# Patient Record
Sex: Female | Born: 1975 | Race: White | Hispanic: No | Marital: Single | Smoking: Never smoker
Health system: Southern US, Community
[De-identification: ages and names within clinical notes are randomized; demographics above are authoritative.]

## PROBLEM LIST (undated history)

## (undated) DIAGNOSIS — D649 Anemia, unspecified: Secondary | ICD-10-CM

## (undated) DIAGNOSIS — R5382 Chronic fatigue, unspecified: Secondary | ICD-10-CM

## (undated) DIAGNOSIS — Z86711 Personal history of pulmonary embolism: Secondary | ICD-10-CM

## (undated) DIAGNOSIS — K219 Gastro-esophageal reflux disease without esophagitis: Secondary | ICD-10-CM

## (undated) DIAGNOSIS — E079 Disorder of thyroid, unspecified: Secondary | ICD-10-CM

## (undated) DIAGNOSIS — R102 Pelvic and perineal pain unspecified side: Secondary | ICD-10-CM

## (undated) DIAGNOSIS — J45909 Unspecified asthma, uncomplicated: Secondary | ICD-10-CM

## (undated) DIAGNOSIS — M1990 Unspecified osteoarthritis, unspecified site: Secondary | ICD-10-CM

## (undated) DIAGNOSIS — M797 Fibromyalgia: Secondary | ICD-10-CM

## (undated) HISTORY — DX: Disorder of thyroid, unspecified: E07.9

## (undated) HISTORY — DX: Fibromyalgia: M79.7

## (undated) HISTORY — DX: Unspecified osteoarthritis, unspecified site: M19.90

## (undated) HISTORY — DX: Unspecified asthma, uncomplicated: J45.909

## (undated) HISTORY — DX: Gastro-esophageal reflux disease without esophagitis: K21.9

## (undated) HISTORY — DX: Personal history of pulmonary embolism: Z86.711

## (undated) HISTORY — PX: LAPAROSCOPY: SHX197

## (undated) HISTORY — DX: Chronic fatigue, unspecified: R53.82

## (undated) HISTORY — PX: OTHER SURGICAL HISTORY: SHX169

## (undated) HISTORY — DX: Pelvic and perineal pain unspecified side: R10.20

## (undated) HISTORY — DX: Anemia, unspecified: D64.9

## (undated) HISTORY — PX: HYSTEROSCOPY WITH D & C: SHX1775

## (undated) HISTORY — PX: COLONOSCOPY: SHX174

## (undated) HISTORY — DX: Pelvic and perineal pain: R10.2

## (undated) HISTORY — PX: CYSTOSCOPY: SUR368

---

## 2001-02-24 ENCOUNTER — Ambulatory Visit (HOSPITAL_COMMUNITY): Admission: RE | Admit: 2001-02-24 | Discharge: 2001-02-24 | Payer: Self-pay | Admitting: Orthopedic Surgery

## 2003-12-16 ENCOUNTER — Ambulatory Visit (HOSPITAL_COMMUNITY): Admission: RE | Admit: 2003-12-16 | Discharge: 2003-12-16 | Payer: Self-pay | Admitting: Family Medicine

## 2004-10-12 ENCOUNTER — Emergency Department (HOSPITAL_COMMUNITY): Admission: EM | Admit: 2004-10-12 | Discharge: 2004-10-12 | Payer: Self-pay | Admitting: Emergency Medicine

## 2004-11-18 ENCOUNTER — Ambulatory Visit (HOSPITAL_COMMUNITY): Admission: RE | Admit: 2004-11-18 | Discharge: 2004-11-18 | Payer: Self-pay | Admitting: Family Medicine

## 2005-06-09 ENCOUNTER — Ambulatory Visit (HOSPITAL_COMMUNITY): Admission: RE | Admit: 2005-06-09 | Discharge: 2005-06-09 | Payer: Self-pay | Admitting: Internal Medicine

## 2005-06-17 ENCOUNTER — Encounter (HOSPITAL_COMMUNITY): Admission: RE | Admit: 2005-06-17 | Discharge: 2005-07-17 | Payer: Self-pay | Admitting: Internal Medicine

## 2005-07-20 ENCOUNTER — Encounter (HOSPITAL_COMMUNITY): Admission: RE | Admit: 2005-07-20 | Discharge: 2005-08-19 | Payer: Self-pay | Admitting: Internal Medicine

## 2005-08-20 ENCOUNTER — Encounter (HOSPITAL_COMMUNITY): Admission: RE | Admit: 2005-08-20 | Discharge: 2005-09-07 | Payer: Self-pay | Admitting: Internal Medicine

## 2005-09-15 ENCOUNTER — Encounter (HOSPITAL_COMMUNITY): Admission: RE | Admit: 2005-09-15 | Discharge: 2005-10-15 | Payer: Self-pay | Admitting: Internal Medicine

## 2006-03-31 ENCOUNTER — Ambulatory Visit (HOSPITAL_COMMUNITY): Admission: RE | Admit: 2006-03-31 | Discharge: 2006-03-31 | Payer: Self-pay | Admitting: Family Medicine

## 2006-05-19 ENCOUNTER — Ambulatory Visit: Payer: Self-pay | Admitting: Physical Medicine and Rehabilitation

## 2006-05-19 ENCOUNTER — Encounter
Admission: RE | Admit: 2006-05-19 | Discharge: 2006-08-17 | Payer: Self-pay | Admitting: Physical Medicine and Rehabilitation

## 2006-05-24 ENCOUNTER — Encounter
Admission: RE | Admit: 2006-05-24 | Discharge: 2006-06-06 | Payer: Self-pay | Admitting: Physical Medicine and Rehabilitation

## 2006-06-22 ENCOUNTER — Encounter
Admission: RE | Admit: 2006-06-22 | Discharge: 2006-07-08 | Payer: Self-pay | Admitting: Physical Medicine and Rehabilitation

## 2006-07-22 ENCOUNTER — Ambulatory Visit: Payer: Self-pay | Admitting: Physical Medicine and Rehabilitation

## 2006-08-18 ENCOUNTER — Encounter
Admission: RE | Admit: 2006-08-18 | Discharge: 2006-11-16 | Payer: Self-pay | Admitting: Physical Medicine and Rehabilitation

## 2006-08-31 ENCOUNTER — Encounter
Admission: RE | Admit: 2006-08-31 | Discharge: 2006-10-17 | Payer: Self-pay | Admitting: Physical Medicine and Rehabilitation

## 2006-09-19 ENCOUNTER — Ambulatory Visit: Payer: Self-pay | Admitting: Physical Medicine and Rehabilitation

## 2006-10-18 ENCOUNTER — Encounter
Admission: RE | Admit: 2006-10-18 | Discharge: 2007-01-16 | Payer: Self-pay | Admitting: Physical Medicine and Rehabilitation

## 2006-11-15 ENCOUNTER — Ambulatory Visit: Payer: Self-pay | Admitting: Physical Medicine and Rehabilitation

## 2007-01-05 ENCOUNTER — Ambulatory Visit: Payer: Self-pay | Admitting: Physical Medicine and Rehabilitation

## 2007-05-02 ENCOUNTER — Encounter
Admission: RE | Admit: 2007-05-02 | Discharge: 2007-06-13 | Payer: Self-pay | Admitting: Physical Medicine and Rehabilitation

## 2007-05-02 ENCOUNTER — Ambulatory Visit: Payer: Self-pay | Admitting: Physical Medicine and Rehabilitation

## 2007-07-25 ENCOUNTER — Ambulatory Visit: Payer: Self-pay | Admitting: Physical Medicine and Rehabilitation

## 2007-07-25 ENCOUNTER — Encounter
Admission: RE | Admit: 2007-07-25 | Discharge: 2007-07-26 | Payer: Self-pay | Admitting: Physical Medicine and Rehabilitation

## 2007-10-10 ENCOUNTER — Encounter
Admission: RE | Admit: 2007-10-10 | Discharge: 2008-01-08 | Payer: Self-pay | Admitting: Physical Medicine and Rehabilitation

## 2007-10-10 ENCOUNTER — Ambulatory Visit: Payer: Self-pay | Admitting: Physical Medicine and Rehabilitation

## 2008-05-01 ENCOUNTER — Encounter
Admission: RE | Admit: 2008-05-01 | Discharge: 2008-05-03 | Payer: Self-pay | Admitting: Physical Medicine and Rehabilitation

## 2008-05-03 ENCOUNTER — Ambulatory Visit: Payer: Self-pay | Admitting: Physical Medicine and Rehabilitation

## 2008-08-15 ENCOUNTER — Encounter
Admission: RE | Admit: 2008-08-15 | Discharge: 2008-08-16 | Payer: Self-pay | Admitting: Physical Medicine and Rehabilitation

## 2008-08-16 ENCOUNTER — Ambulatory Visit: Payer: Self-pay | Admitting: Physical Medicine and Rehabilitation

## 2008-11-21 ENCOUNTER — Encounter
Admission: RE | Admit: 2008-11-21 | Discharge: 2008-11-21 | Payer: Self-pay | Admitting: Physical Medicine and Rehabilitation

## 2009-07-03 ENCOUNTER — Encounter: Admission: RE | Admit: 2009-07-03 | Discharge: 2009-07-03 | Payer: Self-pay | Admitting: Neurology

## 2010-10-15 ENCOUNTER — Emergency Department (HOSPITAL_COMMUNITY)
Admission: EM | Admit: 2010-10-15 | Discharge: 2010-10-15 | Disposition: A | Discharge status: Home / Self Care | Payer: No Typology Code available for payment source | Attending: Emergency Medicine | Admitting: Emergency Medicine

## 2010-10-15 ENCOUNTER — Emergency Department (HOSPITAL_COMMUNITY)
Admit: 2010-10-15 | Discharge: 2010-10-15 | Disposition: A | Discharge status: Home / Self Care | Payer: No Typology Code available for payment source

## 2010-10-15 DIAGNOSIS — M79609 Pain in unspecified limb: Secondary | ICD-10-CM | POA: Insufficient documentation

## 2010-10-15 DIAGNOSIS — M25559 Pain in unspecified hip: Secondary | ICD-10-CM | POA: Insufficient documentation

## 2010-10-15 DIAGNOSIS — M129 Arthropathy, unspecified: Secondary | ICD-10-CM | POA: Insufficient documentation

## 2010-10-15 DIAGNOSIS — E039 Hypothyroidism, unspecified: Secondary | ICD-10-CM | POA: Insufficient documentation

## 2010-10-15 DIAGNOSIS — R51 Headache: Secondary | ICD-10-CM | POA: Insufficient documentation

## 2010-10-15 DIAGNOSIS — IMO0001 Reserved for inherently not codable concepts without codable children: Secondary | ICD-10-CM | POA: Insufficient documentation

## 2010-10-15 LAB — DIFFERENTIAL
Neutro Abs: 5.6 10*3/uL (ref 1.7–7.7)
Neutrophils Relative %: 63 % (ref 43–77)

## 2010-10-15 LAB — CBC
HCT: 34.5 % — ABNORMAL LOW (ref 36.0–46.0)
Hemoglobin: 12 g/dL (ref 12.0–15.0)
MCHC: 34.8 g/dL (ref 30.0–36.0)
RBC: 4.12 MIL/uL (ref 3.87–5.11)
WBC: 8.9 10*3/uL (ref 4.0–10.5)

## 2010-10-15 LAB — COMPREHENSIVE METABOLIC PANEL
ALT: 13 U/L (ref 0–35)
Alkaline Phosphatase: 51 U/L (ref 39–117)
BUN: 11 mg/dL (ref 6–23)
Chloride: 104 mEq/L (ref 96–112)
GFR calc Af Amer: >60 mL/min (ref >60)
Glucose, Bld: 82 mg/dL (ref 70–99)
Potassium: 3.4 mEq/L — ABNORMAL LOW (ref 3.5–5.1)
Sodium: 138 mEq/L (ref 135–145)
Total Bilirubin: 0.3 mg/dL (ref 0.3–1.2)
Total Protein: 7.5 g/dL (ref 6.0–8.3)

## 2010-10-15 MED ORDER — IOHEXOL 300 MG/ML  SOLN
100.0000 mL | Freq: Once | INTRAMUSCULAR | Status: AC | PRN
  Administered 2010-10-15: 100 mL via INTRAVENOUS

## 2011-01-26 NOTE — Assessment & Plan Note (Signed)
Courtney Patton is a 35 year old single female who has a history of  fibromyalgia and degenerative changes in her low back and neck.  She is  back in today for a brief recheck.   She is tolerating the medications well without any side effects.  No  problems with oversedation or constipation.   She continues to be quite an active young lady.  At the last visit she  had had some problems with plantar fasciitis, and in the interim she has  obtained a new pair of shoes and has some gel inserts which she has  placed in them.   She believes her Achilles tendons are somewhat better; however, she is  still having quite a bit of discomfort in the medial mid arch  bilaterally.  The pain is typically worse with activities, and she has  found it to be quite frustrating because she is quite interested in  continuing to work out in some capacity aerobically.   Her pain today is about a 7 on a scale of 10; however, on the average it  is about a 2.  Her low back and cervical region are approximately 2 on a  scale of 10, not particularly bothering her any great deal.  She does  find that exercise improves her pain overall.  She does have some  discomfort in the hips as well as knees, which is about a 2 as well on a  scale of 10, and again she reports this discomfort is much better when  she is active.   The pain is typically worse in her feet with walking.  Pain improves  with exercise and pacing her activity.  Medication helps as well as TENS  unit.   She gets fair relief with current medications prescribed.   She is only able to walk about 5 minutes at this time before her feet  bother her quite a bit.  She is able to climb stairs.  She is able to  drive.  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   She admits to some occasional numbness and tingling, which is not a  major problem for her.  Denies depression, anxiety or suicidal ideation.  Denies problems  controlling bowel or bladder.   Past medical, social, family history otherwise unchanged since her last  visit.   Medications provided by our clinic include Ultram 50 mg, typically  taking between 4 and 6 a day, 1 to 2 p.o. t.i.d.  She is also on Lyrica  50 mg twice daily.   PHYSICAL EXAMINATION:  VITAL SIGNS:  Her blood pressure is 118/73, pulse  68, respiration 18, and 100% saturated on room air.  GENERAL:  She is a well-developed, well-nourished female who appears her  stated age and does not appear in any distress.  She is oriented x 3.  Her speech is clear.  Her affect is bright.  She is alert, cooperative  and pleasant, and she follows commands easily.   Transitioning from sitting to standing is done with ease.  Tandem gait,  Romberg tests are performed adequately.  Heel-toe walking is performed  adequately.   She has mild limitations in lumbar forward flexion and extension.  Reflexes are symmetric and intact in the lower extremities without  abnormal tone.  No clonus is noted.  Sensory exam is within normal  limits for light touch.  Motor strength is 5/5 at hip flexors, knee  extensors, dorsiflexors, plantar  flexors, EHF.   Evaluation of her feet revealed mild tenderness at the metatarsal heads  bilaterally, more tenderness is noted in the mid arch region  bilaterally.  Mild tenderness is noted in the Achilles tendon as well.   IMPRESSION:  1. Plantar fasciitis, metatarsalgia and Achilles tendinitis.  Mild      improvements are noted in the metatarsalgia and Achilles      tendinitis.  Still prominent feature here is plantar fasciitis with      tenderness in the mid arch medially in both feet.  2. Cervicalgia with mild cervical degenerative disk changes.  3. Lumbago and intermittent lower extremity pain.  4. Mild fibromyalgia overlay.   PLAN:  The patient had been on ibuprofen; however, when she tried to  increase her dose to twice a day, she developed some mouth ulcers  and  had discontinued its use.  I will go ahead and give her some samples of  Celebrex today, 15, and discussed the risks and benefits of this  medication with her, she would like to trial it, 200 mg 1 p.o. daily,  and she will call us if she has any trouble with it and discontinue it  if there are any problems from it.  I will see her back in a month,  otherwise, and I have suggested she could trial water running rather  than bicycle or walking or elliptical trainer at this point.           ______________________________  Brantley Stage, M.D.     DMK/MedQ  D:  11/08/2007 13:01:08  T:  11/09/2007 09:29:09  Job #:  [id number]

## 2011-01-26 NOTE — Assessment & Plan Note (Signed)
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   She is back in today and is requesting a refill of her Tramadol and her  Lyrica.   She continues to be quite active and over the last month has noticed a  worsening of some pain in the bottoms of her feet bilaterally, worse on  the left, however.  Her average pain is about a 2 on a scale of 10.  Her  pain interferes very little with her current activities.  She is a very  active woman involved in working out and quite a few other vocational  activities.   The pain is typically worse with standing, bending, sitting, and  inactivity, improves with rest, ice, medication, pacing her activities,  and a TENS unit.  She also has complaints of intrascapular pain as well  as lumbago.   She is able to walk about an hour at a time.  She is able to climb  stairs and drive.   FUNCTIONAL STATUS:  This is a high functioning woman, who is working 40  hours a week as well as working out.  She is independent with her self-  care.   REVIEW OF SYSTEMS:  Negative.  Denies problems with bowel or bladder,  denies depression, anxiety, suicidal ideation, admits to occasional  numbness and tingling.   PAST MEDICAL, SOCIAL, AND FAMILY HISTORY:  Unchanged from last month.   MEDICATIONS PROVIDED BY OUR CLINIC:  1. Ultram 50 mg one to two p.o. up to three times a day.  2. Lidoderm on a p.r.n. basis.  3. Lyrica 50 mg one p.o. b.i.d.   PHYSICAL EXAMINATION:  VITAL SIGNS:  Her blood pressure is 130/76, pulse  80, respirations 18, 100% saturated on room air.  GENERAL:  She is a well-developed, well-nourished female, who appears  her stated age.  She is oriented x3.  Her speech is clear.  Her affect  is bright.  She is alert, cooperative, and pleasant.  She follows  commands easily.  PAIN AND REHAB EVALUATION:  Gait in the room is  normal.  Tandem gait  and Romberg's test were performed adequately.   Reflexes are symmetric and intact.  No abnormal tone is noted.  Motor  strength is 5/5.   Focusing on her feet today, she has tenderness over the metatarsal heads  bilaterally in both right and left foot as well as through the arch of  her foot bilaterally and mildly so in the Achilles tendons bilaterally.   Review of her shoes, they are a year old, they are very worn, and the  metatarsal area of the shoe breaks very easily bilaterally in both  shoes.   IMPRESSION:  1. Cervicalgia with mild cervical degenerative disk changes.  2. Lumbago and intermittent lower extremity pain.  3. Mild fibromyalgia overlay.  4. New problem is bilateral plantar fasciitis, metatarsalgia, as well      as Achilles tendinitis.   Will recommend slightly increasing her ibuprofen from one to three times  a day for a few days.  I have reviewed her workout routine and found  that she been walking or running up and down some hills recently, which  may have exacerbated this for her.  I would like her to just limit her  workup to about 15 minutes on a flat treadmill.  She had also been doing  this on an incline, which may exacerbate this problem as well.  She will  obtain a new pair of shoes and I will go ahead and refill her Lyrica 50  mg one p.o. b.i.d., #60, as well as her Ultram 50 mg one to two p.o.  t.i.d. p.r.n. upper and lower back pain, #180, with two refills.  I will  see her back in a month.  She has been stable on the above-medications.  I cautioned her again regarding taking ibuprofen.  It can cause  abdominal problems including bleeding.  She is aware and will limit its  use to just a few days.  We will see her back.           ______________________________  Brantley Stage, M.D.     DMK/MedQ  D:  10/11/2007 14:48:35  T:  10/11/2007 21:33:27  Job #:  [id number]

## 2011-01-26 NOTE — Assessment & Plan Note (Signed)
REASON FOR VISIT:  Courtney Patton is a 35 year old, single female who is  being seen in our pain and rehabilitation clinic for multiple pain  complaints with chronic pain throughout cervical, parascapular,  thoracic, low back, posterior thigh regions and also in hands and feet.  Courtney Patton is felt to have fibromyalgia.  Courtney Patton has been on Lyrica for almost 1  year now and since her initial visit to our clinic back in September  2007, her average pain at that time was 7/10 and averaged between 5-6/10  on pain scale.   At this point, her average pain is about 3/10 and can go up to 4-5/10 on  occasion.  Pain is described as diffuse, gnawing and cramping in  sensation, again localized to parascapular, cervical, lumbar regions and  knees.   Courtney Patton has been on Lyrica for about a year.  Courtney Patton reports good relief with  this medication.  Courtney Patton also takes Ultram one to two tablets up to three  times per day.   Her functional status over the last year has significantly improved.  Courtney Patton is now working 10-12 hours a day up to 6 days a week.  Courtney Patton is  working out regularly 2-1/2 to 3 hours a day, 45 minutes of walking at a  time.  Courtney Patton also does some other exercises.   Courtney Patton does admit to some anxiety and over the last year, has noted some  problems with concentration, word finding and remembering, however,  these are subtle, but is considering returning back to school and has  concerns regarding these areas.   No changes in her past medical, social or family history other than that  stated above.  Courtney Patton notes family history is remarkable for thyroid  problems, cancer, fibromyalgia and arthritis.   MEDICATIONS:  1. Ultram 50 mg one to two p.o. t.i.d. on a p.r.n. basis.  This      medication will be due in September 2008, for a refill.  2. Lidoderm on p.r.n. basis.  3. Lyrica 25 mg two tablets in the morning, one tablet in the      afternoon and two tablets nightly.   PHYSICAL EXAMINATION:  VITAL SIGNS:  Blood pressure  115/71, pulse 74,  respirations 18, saturations 100% on room air.  GENERAL:  Well-developed, well-nourished female who appears her stated  age and does not appear in any distress.  Courtney Patton does report a 35 pound  weight loss in the last year with exercising and diet.  NEUROLOGIC:  Courtney Patton is oriented x3, speech is clear.  Affect is bright.  Courtney Patton is cooperative and pleasant.  Courtney Patton follows commands without any  problems.  Courtney Patton transitions from sitting to standing without pain  behaviors.  Her gait in the room is normal.  Her balance is good.  Courtney Patton  has excellent range of motion in her cervical and lumbar spine.  Courtney Patton has  tender points throughout the parascapular regions, medial elbows,  lateral hips, upper lumbar region and medial knee area.  Her motor  strength is excellent in the upper and lower extremities.  Reflexes are  symmetric and intact.  No abnormal tone is noted.  Motor strength is  good throughout.  No sensory deficits are appreciated.   IMPRESSION:  1. Cervicalgia with mild cervical degenerative disc changes.  2. Fibromyalgia overlay.  3. Lumbago with intermittent lower extremity pain.  4. Hand stiffness over the last couple of months, overall improved.   PLAN:  May still consider an HLA-B27 and sedimentation  rate with her in  the next 3 months.  Will continue to encourage her in her community  based exercise program as well as continuing her work activities.   Courtney Patton has done quite well over the last year with respect to her  functional status.  Courtney Patton has not exhibited any aberrant behavior.  Courtney Patton  takes her medications as prescribed and overall has improved her  function considerably with them.   Given her recent complaints of some mild forgetfulness and  concentration, will have her start to decrease her Lyrica slightly.  We  will have her drop the afternoon dose.  Courtney Patton will continue on 50 mg twice  a day.  Courtney Patton was given samples of 50 mg tablet of Lyrica today as well,  four  bottles.   At the next visit, we will consider HLA-B27.  Given her generalized  stiffness and overall complaints, I would like to rule out ankylosing  spondylitis with her and will consider sedimentation rate as well if Courtney Patton  continues to have similar complaints.  We will see her back in 3 months.  Courtney Patton has been stable on these medications.           ______________________________  Brantley Stage, M.D.     DMK/MedQ  D:  05/03/2007 10:34:54  T:  05/04/2007 29:56:21  Job #:  [id number]

## 2011-01-26 NOTE — Assessment & Plan Note (Signed)
Courtney Patton is a 35 year old single female who has a history of  fibromyalgia and degenerative changes in her low back and neck.  She is  back in today for brief recheck and she states overall she has been very  active.  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  Her chief complaint is  periscapular pain and low back pain.  She states her average pain is  about 3 on a scale of 10 in the low back and the neck it is about 5.  Pain is described as sharp, dull, and constant aching in nature.  Her  pain is not currently interfering with her activity level.  Pain is  typically worse in the morning and toward the end of the day.  It gets  worse with bending and activity, standing; improves with rest, heat,  therapies, pacing her activities, medication and TENS unit.  She gets a  little relief with tramadol and she gets a fairly good relief with the  Lyrica for her fibromyalgia symptoms.   She is able to walk at least now at a time.  She is able to climb stairs  and drive functional status.  Courtney Patton is very high functioning  individual as previously noted.  She is working 75-80 hours a week as  she is independent with all of her self care.  She reports some  intermittent numbness in her feet, which she has had for years which  comes and goes not really interfering with any activity.   No other changes in her past medical, social, and family history.   Medications, which prescribed to this clinic include Ultram 50 mg 1-2  three times a day and Lyrica 50 mg twice a day.   PHYSICAL EXAMINATION:  VITAL SIGNS:  Blood pressure is 121/69, pulse 74,  respiration 18, and 100% saturate on room air.  GENERAL:  She is a well-developed, well-nourished female who does not  appear in any distress today.  She is oriented x3.  Speech is clear.  Affect is bright.  She is cooperative and pleasant.   She has a very little in the way of limitation  with respect to cervical  range of motion.  She has full shoulder range of motion.  Mild  limitations are noted in lumbar range of motion.  Reflexes are symmetric  and intact in lower extremities.  She has no abnormal tone or clonus is  noted.   She has tenderness to palpation throughout the cervical and lumbar  paraspinal muscles and periscapular muscles in the neck region.  She has  tenderness to palpation mildly along the left Achilles tendon.  She has  tenderness also in the area of the left plantar fascia and also in the  left metatarsal head.  In the right, very little tenderness in the  Achilles tendon and mildly in the plantar fascia on the right and she  does have some increased tenderness in the mid metatarsal head on the  right.   Observing her shoes, her athletic shoes she brings in, are very worn out  breaking quite easily at the metatarsal heads.  She has worn through  quite a bit of the rubber on these shoes.   IMPRESSION:  1. Plantar fasciitis, metatarsalgia, and Achilles tendonitis overall      some improvement in these areas more so on the left than on the      right.  2. Cervicalgia with mild cervical degenerative changes.  3. Lumbago with intermittent lower extremity pain, overall stable.  4. Mild fibromyalgia overlay.   PLAN:  She does not need any refills on her medication at this time.  She continues to take her medications as prescribed and has not  exhibited any aberrant behavior.   Incidentally, she did mention that she had been exposed to poison oak  earlier this year and has been placed on prednisone tapers 2 times in  the spring.   With respect to her right foot tingling with sitting, she states that  this has been going on 4-5 years, and she really does not want to repeat  an MRI at this time if this is not a significant problem for her.  The  last MRI was November 18, 2004.   No other changes in her pain status.  She is overall satisfied with   current medications and management of her pain at this time.           ______________________________  Brantley Stage, M.D.     DMK/MedQ  D:  05/03/2008 12:47:06  T:  05/04/2008 04:06:26  Job #:  [id number]

## 2011-01-26 NOTE — Assessment & Plan Note (Signed)
Courtney Patton is a 35 year old single female who is followed in our [facility]  and Rehabilitative Clinic for multiple chronic pain complaints, which  are partially related to her fibromyalgia.  She is back in today for  refill of her tramadol.   She reports she has continued to stay very active.  She works between 55  and 65 hours a week.  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   Her average pain is between 4 and 5 on a scale of 10.  Her chief  complaint is bilateral foot pain specifically through the metatarsal  area.   She was last seen by me in May 03, 2008, and at that last visit, she  did obtain some new athletic shoes.  She states that they helped at the  beginning; however, she wears them daily all day and they are now worn-  out again, and she lacks the support through the metatarsal area in the  shoe.   Her pain really does not interfere with general activity.  Her pain is  worse in the morning and toward the end of the day.  She sleeps fairly  well.  She also had some complaints of pain in the area of her sternum,  bilateral hand pain, bilateral hip pain, foot pain, and heel pain as  well as some intermittent low back pain.   FUNCTIONAL STATUS:  Courtney Patton is a very high-functioning individual,  working up to 65 hours a week.  She is independent with all of her self-  care.   REVIEW OF SYSTEMS:  Noncontributory at this time.  She does have  occasional hand numbness and tingling.   No changes in past medical, social, or family history since our last  visit.   MEDICATIONS:  Provided through this clinic include:  1. Tramadol 1-2 tablets up to 3 times a day.  2. Lyrica 50 mg 1 p.o. b.i.d.   PHYSICAL EXAMINATION:  VITAL SIGNS:  Blood pressure is 121/80, pulse 72,  respiration  18, and 100% saturate on room air.  GENERAL:  She is a well-developed, well-nourished woman, who does not  appear in any distress.  She is oriented x3.  Her speech is clear.  Her  affect is bright.  She is alert, cooperative, and pleasant.  Follows  commands without difficulty.  Answers all questions appropriately.   Cranial nerves and coordination are grossly intact.  Her reflexes are 2+  and symmetric in upper and lower extremities without abnormal tone or  clonus.   Sensory exam is normal to light touch and pinprick was deferred today.   Gait is normal.  Tandem gait and Romberg test are all performed  adequately.   She has multiple area of tenderness throughout the cervical paraspinal  musculature and upper trapezius.  Hip flexibility was evaluated today.  She has excellent range of motion in both hips including checking her  hip flexors today.  She performed an adequate Thomas test.   She has excellent lumbar range of motion in all planes.  Cervical and  shoulder range of motion are also in normal limits.   Palpation reveals tenderness also at the attachment of the Achilles  tendon distally as well as throughout the metatarsal head bilaterally.   IMPRESSION:  1. Multiple chronic pain complaints with diagnosis of fibromyalgia.  2. Plantar fascitis, metatarsalgia, and Achilles tendinitis, has shown      improvement in the plantar fascitis and she does have a very mild      Achilles tendinitis at this time.  Her biggest issue is the      metatarsalgia bilaterally.  3. Cervicalgia with mild cervical degenerative changes.  4. Lumbago.  5. Fibromyalgia overlay.   PLAN:  Given her history of the chronic dizziness and pain complaints  actively she has experienced as well as the tendinitis, we would like to  check HLA-B27 with her.  This has been discussed with her in the past,  however, finances have constrained her ability to pursue further  diagnostic workup.  In the next  several months, anticipate ordering this  blood test for her.  She is also interested in pursuing a workup with  rheumatology as well.  Apparently, she has a family history which is  significant for rheumatoid arthritis in multiple relatives.  In 2007,  ANA and rheumatoid factor were evaluated and were negative and less than  20 respectively with normal white count.   We will refill the following medications for her.  Tramadol 1-2 p.o.  t.i.d. p.r.n. back pain, #180 with 3 refills, Relafen 500 mg 1 p.o.  daily p.r.n. back pain, #30 with 2 refills.   She states that she really is not taking her antiinflammatory regularly  at this time.  She has been taking Celebrex not more than 10 days per  month over the last several months.  She has not had any problems with  abdominal pain and since she really only takes her nonsteroidals rather  infrequently, we will have her discontinue her Prilosec.  She will be  following back up with her primary care physician, Karleen Hampshire, in  the next several months and she will ask him if she needs to stay on  this from his standpoint.  I will see her back in 4 months and  anticipate ordering some blood work at that time.           ______________________________  Brantley Stage, M.D.     DMK/MedQ  D:  08/16/2008 11:13:02  T:  08/17/2008 01:29:38  Job #:  [id number]
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
## 2011-01-29 NOTE — Assessment & Plan Note (Signed)
Courtney Patton is a 35 year old female who has been referred to our pain and  rehabilitation clinic by Dr. Regino Schultze.  She is back in for a recheck today.  Again, she has multiple pain complaints including cervicalgia which she  describes as a 3 or a 4 on a scale of 10, not her main problem.  She also  complains of some bilateral intermittent hand achiness, which again is not  her main problem.  She states that this started about 2-1/2 weeks ago.  It  does not bother her everyday.  She reports that she has some periumbilical  pain which has recurred.  She had a work-up last year, pelvic and abdominal  CT scans.  Again, this is not all the time, and I asked her to followup with  her primary care physician for this periumbilical pain.  She reports some  frequency associated with it.   She also has some low back pain, which is her primary problem.  She reports  it about a 6 on a scale of 10, worse with activity including bending,  sitting.  Pain overall improves with rest, heat, therapy, pacing her  activities, medication, and she has found the TENS unit to be quite helpful,  as well.   Her pain is described as gnawing and cramping, sharp, burning, aching,  tingling, fairly constant in the low back region.   She can walk about 25 minutes at a time.  She has been participating in a  physical therapy program, has a good stretching program, which she is for  the most part trying to participate in.  She is independent with her self  care.  She needs some assistance with higher level household duties.   She admits to some depression.  She reports that she had some mild  depression for 2 weeks out of the last month.   She also complains of constipation.   She maintains contact with Dr. Regino Schultze at [facility] in [place].   No other change is noted in past medical, social or family history.   PHYSICAL EXAMINATION:  VITAL SIGNS:  Blood pressure is 127/66, pulse 91,  respirations 16, 100%  saturation on room air.  GENERAL:  She is a well-developed, mildly obese female who appears her  stated age.  She is oriented x3.  Her affect is bright and alert.  She is  cooperative and pleasant.  Her speech is clear.  She follows commands  without any difficulty.  She transitions from sit to stand quite easily.  Her gait in the room is normal.  Her cervical range of motion is quite good.  She does lack a little bit of motion on the left compared to the right,  about 5 degrees difference.  She has full shoulder range of motion.  Her  lumbar range is quite good, as well.  She complains of some discomfort with  forward flexion.  Extension does not bother her.  Seated reflexes are 2 to  3+.  No clonus is noted.  No abnormal tone is appreciated.  She has good  strength throughout both upper and lower extremities.  She has multiple  tender areas throughout the parascapular region, especially on the right and  in the T8 to T10 region with palpation across the low thoracic area and in  the lower lumbar area, as well, she has tender points.  Coordination is  grossly intact.  Romberg's test and tandem gait are all within normal  limits.   MEDICATIONS:  The medications from this clinic include:  1. Flexeril 10 mg one p.o. daily to b.i.d., #60.  2. Lyrica 25 mg three times a day.  3. She also is taking 10/50 hydrocodone.  It had been three times a month;      however, since she started using a TENS unit, she is down to twice a      month with this.  4. She does taken 400-600 mg of ibuprofen each day.  5. She brings in some vitamin supplements which she is currently taking,      as well.   She is currently off diclofenac.   IMPRESSION:  1. Cervicalgia with mild cervical degenerative disk disease.  2. Lumbago with intermittent neuropathic lower extremity pain.  3. Probable fibromyalgia overlay.  4. Hand stiffness over the last couple of weeks.   PLAN:  1. Will give her a prescription for  hydrocodone 10/650 one p.o. b.i.d.,      #60.  2. Will have her continue to take ibuprofen 400-600 mg on a daily basis.  3. Will increase Neurontin from 25 mg t.i.d. to 50 mg b.i.d., and then      after 5 days another increase to 50 mg t.i.d.  4. Will also give her a prescription for Dulcolax tablets 1-2 p.o. q      evening.  5. If the hands become more of a problem, may consider EMG nerve      conduction studies, may consider hand splints for the evening.  6. She also has a history of degenerative lumbar disk disease,      predominantly L3-4 and L4-5.  7. She has been stable on these medications.  Will obtain some basic      rheumatologic studies on her today, _________, ANA, sedimentation rate,      CBC with differential.  8. Will see her back in a month.           ______________________________  Brantley Stage, M.D.     DMK/MedQ  D:  07/25/2006 10:37:20  T:  07/25/2006 11:59:20  Job #:  [id number]   cc:   Kirk Ruths, M.D.  Fax: [phone]

## 2011-01-29 NOTE — Assessment & Plan Note (Signed)
Courtney Patton is a 35 year old single female who is being seen in our Pain  and Rehabilitation Clinic for multiple pain complaints with chronic pain  throughout the cervical, parascapular, thoracic, low back, and posterior  thigh regions, also into the hands and feet.   She states her average pain is about a 5 on a scale of 10 and in the  clinic this morning it is about a 2.  Her pain interferes very little  with her activities of daily living or her general activity.  She is  sleeping well.  Pain is specifically worse with bending, sitting,  activities, and standing.  It improves with rest, heat, ice, exercise,  pacing her activities, medications, and TENS unit.   She is getting fair relief with current medicines that she is receiving  from this clinic.   MEDICATIONS:  Prescribed through this clinic include:  1. Flexeril 10 mg up to twice a day.  2. Hydrocodone 10/325 one to two times a day.  3. Dulcolax tablets on p.r.n. basis.  4. Lyrica 50 mg in the morning, 25 mg in the p.m., and 50 mg q. h.s.   She is currently self-employed, does some substitute teaching and  babysitting work as well.  She is independent with all of her self-care.  She admits to occasional numbness and tingling spasms and also admits to  constipation.   She follows up with Kirk Ruths, M.D. for primary care.   PAST MEDICAL HISTORY:   SOCIAL HISTORY:   FAMILY HISTORY:  Unchanged since last visit.   PHYSICAL EXAMINATION:  VITAL SIGNS:  Blood pressure 115/76, pulse 99,  respirations 16, 100% saturated on room air.  GENERAL:  She is a well-developed, well-nourished female who appears her  stated age.  She is oriented x3.  Her affect is bright and alert.  She  is cooperative and pleasant.  She does not display any pain behaviors.  She follows commands without difficulty.  She transitions from sit to  stand without any problems.  Her gait is entirely normal.  She has  excellent lumbar range of motion  with respect to flexion and she is able  to touch her toes.  She has fairly good extension as well.  She reports  side-bending bothers her somewhat.  Her range is within normal limits as  well, but possibly slightly diminished.   Her reflexes are symmetric and intact in the lower extremities.  Muscle  bulk is excellent.  Motor strength is 5/5 with hip flexors, knee  extensors, dorsiflexors, and plantar flexors.  No numbness is  appreciated with light touch.  No abnormal tone is noted.  Straight leg  raise is negative.   She has good cervical range of motion and full shoulder range of motion  as well.   IMPRESSION:  1. Cervicalgia with mild cervical degenerative disk disease.  2. Probable fibromyalgia overlay.  3. Lumbago with intermittent neuropathic lower extremity leg pain.  4. Hand stiffness over the last couple of months which is overall      improved.   Last visit, I had ordered a set rate, the __________  27.  She states  that she has insufficient financial funds to obtain the blood work at  this time.  She would like to defer it.   She is complaining of concerns of easy bruising as well as abdominal  bloating and constipation.  I asked her to follow up with Dr. Regino Schultze  for these symptoms.   Overall, Ms.  Patton since the fall has done quite well with respect to  her average pain.  Back in September, when she was initially seen, her  average pain was between 5-7 on a scale of 10, significantly interfering  with her general activity.  Currently she is down to about a 2 on a  scale of 10 and has very little interference of her pain with her  activity level.  She is currently walking 2 miles a day, is using the  elliptical trainer up to 17 minutes, bicycle 20-22 minutes.  She is  doing some light weight work and is participating in a pool program  three times a week.  She reports a weight loss of approximately 20  pounds over the last several months.   Functionally, she is  doing quite well with respect to pain management.  We will switch her from hydrocodone now to Ultracet one to two tablets  up to three times a day 180 with one refill.  I will see her back in 2  months.           ______________________________  Brantley Stage, M.D.     DMK/MedQ  D:  11/16/2006 11:07:32  T:  11/16/2006 12:15:02  Job #:  [id number]   cc:   Kirk Ruths, M.D.  Fax: [phone]

## 2011-01-29 NOTE — Assessment & Plan Note (Signed)
Courtney Patton is a 35 year old female who has been referred to our pain clinic  by Dr. Regino Schultze.  She is back in for a recheck.  She was initially seen last  month on May 20, 2006.   She has multiple pain complaints, predominantly cervicalgia and scapular  related pain as well as low back and buttock referred pain.  She also has  some other pain complaints including some anterior rib pain, bilateral groin  pain and bilateral knee pain, intermittent numbness and tingling in her  hands and feet and also complains of some problems concentrating and  forgetfulness.   She states her average pain is about a 7 on a scale of 10.  She has fairly  good sleep.  Pain is described as fairly constant and variable in its nature  from sharp, burning, dull, stabbing, tingling and aching.  The pain is  typically worsened with bending, sitting, inactivity.  She has trouble  sitting or standing for any length of time.  The pain improves with rest.  She exercises pacing her activities and medication.   She gets fair relief with the current medications that she is on.  She takes  10/650 hydrocodone up to three times a day.  She recently has been started  on Lyrica.  She was taking up to 100 mg but is unable to afford it.  However, through a [facility] she was able to obtain 25 mg three times a  day, three refills.   She is able to walk 25-30 minutes at a time.  She has attempted to start  doing some aerobic conditioning.  Developed some right lateral hip pain when  she started walking more than 30 minutes at a time.  She apparently  increased her walking from essentially no exercise up to 30 minutes in a  just a couple of days when she developed some lateral hip pain.   She is self-employed, International aid/development worker.  She is independent with all of her  self care.  She admits to some numbness, tingling, dizziness, forgetfulness.  No new changes in past medical, social or family history since her last  visit.   She is single and lives alone.   PHYSICAL EXAMINATION:  Blood pressure 127/86, pulse 93, respirations 17,  100% saturating on room air.  She is a mildly obese, white female who  appears her stated age.  She is oriented x3.  Her affect is bright, alert,  cooperative and pleasant.  Speech is clear.  Follows commands without any  difficulty.  Does not appear in any distress.  She transitions from sit to  stand without any problems.  She reports some discomfort with rotation of  her cervical spine.  Reports a discomfort with forward flexion, especially  at end range.  Her gait is, however, normal stride length.  No antalgia is  noted.  Romberg test, tandem gait and heel-toe walking are all within normal  limits.  Seated reflexes are symmetric and intact in the lower extremities.  Motor strength is excellent in the lower extremities.  No deficits are  appreciated.  Coordination is grossly intact.   CURRENT MEDICATIONS:  1. Diclofenac 75 mg 1 p.o. b.i.d.  She has run out of this and finds it      cost prohibitive.  2. Hydrocodone/Lorcet Plus 10/650 3-4 tablets a day.  3. Flexeril 10 mg once a day.  4. Calcium supplement.  5. Currently on Lyrica 25 mg 3 times a day tolerating this well.  IMPRESSION:  1. Cervicalgia with mild cervical degenerative disc disease.  2. Lumbago with intermittent neuropathic lower extremity pain.  3. Probable fibromyalgia overlay.   PLAN:  Urine drug screen essentially negative except for hydrocodone which  has been prescribed for her.  Anticipate refilling hydrocodone and Flexeril  as needed.  She has a three-month supply of Lyrica 25 mg t.i.d.  She has  just completed physical therapy educating her on proper body mechanics and  flare up protocol, disc ergonomics.  We will pursue further therapy and  lower extremity strengthening flexibility program to address tightness in  iliopsoas and iliotibial band and external rotators around her hip.  I will   also have them do a TENS trial with her.  We will see her back in a month.  Also recommend she follow up with Dr. Regino Schultze.  Apparently, this woman has a  mother and a sister who are hypothyroid.  She complains of some symptoms of  lethargy and forgetfulness. We would like to have this possibly checked by  Dr. Regino Schultze as well.  We will see her back in a month.           ______________________________  Brantley Stage, M.D.     DMK/MedQ  D:  06/17/2006 12:58:56  T:  06/19/2006 21:27:33  Job #:  [id number]   cc:   Kirk Ruths, M.D.  Fax: [phone]

## 2011-01-29 NOTE — Group Therapy Note (Signed)
[name] is a 35 year old single female who has been referred to our  pain and rehabilitative clinic kindly by Dr. Regino Schultze from [facility] in  [place].  Courtney Patton states that she believes her back pain may have  begun after a knee injury and knee surgery back in 2002.  She has had  intermittent problems with her back since that time.   Earlier this spring, she suffered some increased low back pain and underwent  MRI of her low back, which was compared to a previous scan back in March,  2006.  Essentially, she has some posterolateral disk herniation at L3-4,  which narrows the left lateral recess and a broad-based disk herniation at  L4-5.   Prior to last year, she also developed some cervicalgia and underwent  cervical MRI, which was positive for mild distal abnormalities without focal  herniation or root compression.   Courtney Patton states she has pain throughout her cervical and parascapular  region, somewhat into the thoracic paraspinal musculature and predominantly  in the lower lumbar and posterior thigh region.   She states that her average pain now is about a 5-6 on a scale of 10.  Earlier this spring and summer, it had been much higher, and her function  was significantly diminished because of her increased pain.   In the interim, she had been followed by Dr. Newell Coral and also has had a  consultation with Dr. Gerlene Fee and had been told that she does not have a  surgical problem at this time.   She has been treated with anti-inflammatory medications, hydrocodone, and  Flexeril for almost a year now.  She stopped working back in March, 2006  with the onset of her cervicalgia and worsening of her low back.  She  reports that she sleeps well.  She reports fair to good relief with the  current meds that she is on.  She is currently engaging in a pool program to  maintain her strength and flexibility.   She is able to walk about 10 minutes at a time.  With increasing  her walking  time beyond this, she gets pain into the low back and into the posterior hip  region, especially on the left posterior thigh and buttocks area.  She is  able to climb stairs.  She is able to drive.  She is independent with all of  her self care and needs help with higher level household duties.   REVIEW OF SYSTEMS:  She denies problems controlling bowel or bladder.  She  denies depression, anxiety, suicidal ideation.  She does admit to some  numbness, tingling, trouble walking, spasms, dizziness.  She states that  since she took one tablet of an antihypertensive pill several weeks ago that  this may have caused her to have some ongoing symptoms of some shakiness and  dizziness.  I referred her back to Dr. Regino Schultze for further workup regarding  these particular symptoms.   PAST MEDICAL HISTORY:  Negative for diabetes, ulcers, cancers, kidney  problems, thyroid problems, heart problems, high blood pressure.   PAST SURGICAL HISTORY:  Positive for microscopic knee surgery in June, 2002,  left knee.   Patient is single.  She lives by herself.  She denies a history of substance  use.  Denies alcohol use.  Denies smoking.   FAMILY HISTORY:  Positive for lung disease, diabetes, hypertension, thyroid  cancer, and back problems in her grandmother.   CURRENT MEDICATIONS:  1. Diclofenac 75 mg  1 p.o. b.i.d.  2. Hydrocodone/Lorcet Plus 10/650 3-4 tablets per day.  3. Flexeril 10 mg 1-2 daily.  4. Calcium/vitamin D supplement.   PHYSICAL EXAMINATION:  VITAL SIGNS:  Blood pressure 141/62, pulse 94,  respirations 16, 97% saturated on room air.  GENERAL:  She is a well-developed, mildly obese female who appears her  stated age.  She does not appear in any distress today.  She is oriented x3.  Her affect is bright, alert.  She is cooperative and pleasant.  She gets up  after sitting.  She appears a bit stiff.  Her gait, however, is symmetric  and nonantalgic.  Her Romberg's test and  tandem gait are within normal  limits.  She has some very minimal limitations in cervical range with  rotations to the left.  She has full shoulder range.  Lumbar range is quite  good, but she reports pain at end range and upon returning up from forward  flexion.  She has good lumbar extension on exam today as well.  Seated, her  reflexes are 2-3+ throughout.  No clonus is appreciated.  No abnormal tone  is appreciated.  She has some increased sensitivity in the left C5  dermatome, in the right L3 or 4 dermatome and in the left L5-S1 dermatomes.  Note that this is not decreased sensation, however, it is increased  sensitivity to the pinprick in these regions.  She has multiple areas of  tenderness to palpation throughout the cervical paraspinal musculature,  parascapular musculature, gluteal region, in the region of the quadratus  lumborum as well as medial elbows and medial knee areas.  However, her motor  strength is quite good throughout both upper and lower extremities.  I do  not find any focal weakness on exam today.  Coordination is grossly intact.   IMPRESSION:  1. Cervicalgia with mild cervical degenerative disk disease.  2. Lumbago with symptoms of intermittent neuropathic lower extremity pain,      especially in the left lower extremity.  3. Possible fibromyalgia overlay.   PLAN:  1. Will obtain urine drug screen.  2. Will refill Flexeril 10 mg 1 p.o. daily up to b.i.d. #60.  3. We will start her on Lyrica 25 mg 1 p.o. nightly x7 days, then b.i.d.      x7 days, then t.i.d.  We will increase dose accordingly next month if      she tolerates this.  4. We will get her started in a physical therapy educational program,      emphasis on education of proper body mechanics, postural education,      ergonomic training, pain flare-up management.   Today I reviewed how to use acetaminophen appropriately.  Would like to see her decrease the amount of Lorcet Plus she is taking.  She  may try to  substitute a dose of an Extra Strength Tylenol for one of her Lorcet Plus  over the next month.  We also discussed possibly switching some of her  nonsteroidal anti-inflammatory medications.  We will discuss this further  next month.  May also try Lidoderm in the next month.  Ultimate goal may be  to try to decrease overall narcotics load by using adjuvant and education  therapy.  Would also consider the possibility of using a TENS unit.  She is  already involved in a pool program, which I will also encourage her to  continue.  We will see her back in a month.  ______________________________  Brantley Stage, M.D.     DMK/MedQ  D:  05/20/2006 13:38:44  T:  05/20/2006 18:35:14  Job #:  [id number]   cc:   Kirk Ruths, M.D.  Fax: [phone]

## 2011-01-29 NOTE — Assessment & Plan Note (Signed)
HISTORY OF PRESENT ILLNESS:  Courtney Patton is a 35 year old female who is  being seen in our Pain and Rehabilitative clinic for multiple chronic  pain complaints.  She is a patient of Dr. Regino Schultze.  She is back in today  for recheck and refill of her medications.   She states her average pain is about a 4 of 5 on a scale of 10.  She has  multiple pain complaints throughout the cervical and parascapular  regions as well as in the low back radiating through the left posterior  thigh, bilateral knees, and feet and intermittent hand numbness and  tingling.   She states her pain is intermittent in its nature, sometimes more  constant.  She gets good sleep, however.  She gets fair to good relief  with the current medications that she is on.  She has been taking Lyrica  now 50 mg in the morning, 25 in the afternoon, and 50 mg at night.  She  believes this is helping fairly well.   She can walk 20-30 minutes at a time.  She is able to climb stairs and  drive.  She is self-employed as a Education officer, environmental person.  She is only cleaning  on an occasional basis; however, she would like to start working full  time again.   She has had some recent blood work which we reviewed with her today.  CBC, ANA, sedimentation rate, rheumatoid factor were all within normal  limits for her.   PHYSICAL EXAMINATION:  VITAL SIGNS:  Blood pressure is 111/70, pulse 88,  respirations 17, 100% saturated on room air.  GENERAL:  She is a well-developed, well-nourished female who appears her  stated age.  She did not appear in any distress.  NEUROLOGIC:  She is oriented x3.  Her speech is clear.  Her affect is  bright and alert.  She is cooperative and follows commands without any  problems.   She transitions from sit to stand easily.  Gait in room is normal.  She  has good range of motion to the right.  She lacks some range with  rotation to the left.  She has full shoulder range of motion.  She does  complain of some pain in  the right shoulder with full abduction.   Further evaluation of her right shoulder reveals slightly decreased  passive motion compared to the left.  With internal rotation she was  able to reach about 80 degrees on the right and on the left, 90.  Internal rotation is 45 on the right and on the left is 60.   Her motor strength is good.  There are no sensory deficits appreciated.  She has normal reflexes in the upper and lower extremities.   MEDICATIONS:  Medications brought to this clinic include the following:  1. Flexeril 10 mg one p.o. b.i.d.  2. Lyrica 50 mg q.a.m., 25 q.p.m., and 50 mg q.h.s.  3. She also takes hydrocodone up to two tablets of the 10/650s per      day.  These were recently refilled for her on August 19, 2006.      She has 56 left.   IMPRESSION:  1. Cervicalgia with multiple cervical degenerative disks.  2. Lumbago with intermittent neuropathic lower extremity pain.  3. Probable fibromyalgia overlay.  4. Hand stiffness.   PLAN:  Refill her Lyrica today 25 mg two p.o. q.a.m., two p.o. q.h.s.,  and one p.o. q.p.m.  Will have her set up for physical therapist  to help  her with some scapular stabilization exercises on the right to improve  her right shoulder discomfort with activity.  Will see her back in one  month.  She has been stable on these medications.           ______________________________  Brantley Stage, M.D.     DMK/MedQ  D:  08/22/2006 14:49:32  T:  08/23/2006 00:04:08  Job #:  [id number]

## 2011-01-29 NOTE — Assessment & Plan Note (Signed)
FOLLOWUP VISIT   SUBJECTIVE:  Courtney Patton is a 35 year old female who is being seen in our  pain clinic for multiple pain complaints including cervicalgia and pain  around the bilateral scapular region and into the shoulder region,  bilateral low back pain which radiates laterally across the buttocks  down the lateral thigh.   She has been involved in a physical therapy program predominantly to  focus on the cervical region.  She is also doing scapular stabilization  exercises.  She has had education on proper posture, body mechanics and  has a home exercise program which she is currently doing as well.   She states overall she believes the pain has calmed down over the last  month.  Her average pain today is about a 3 on a scale of 10 for her  cervical region, low back is about a 6, average pain on general is about  a 5 on a scale of 10.  It interferes moderately with her activity, not  at all with enjoyment of life.   Sleep is good.  Pain is exacerbated by activities and improves with  rest, heat, exercise, pacing her activities, medications, TENS unit.   She reports fair relief with current meds that she is on.   She states that she accidentally ran out of Lyrica and noted a  significant improvement in her pain when she restarted it.  She does  feel that it is helping quite a bit.   She is felt to have somewhat of a fibromyalgia overlay to her pain as  well.   She is walking 30-35 minutes at a time.  She is exercising almost daily.  She is able to climb stairs, drive.  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  She is independent with her self care.  No areas  of persistent numbness are noted by the patient.  She denies suicidal  ideation.  She denies problems controlling bowel or bladder.   PAST MEDICAL, SOCIAL, FAMILY HISTORY:  Otherwise unchanged from last  visit.   PHYSICAL EXAMINATION:  VITAL SIGNS:  Blood pressure is 113/69, pulse 85,  respirations  16, 100% saturation on room air.  GENERAL:  She is a well developed and well nourished female who appears  her stated age.  She is dressed appropriately.  Her speech is clear.  Her affect is bright.  She does not appear in any distress.  MUSCULOSKELETAL:  She transitions from sit to stand independently.  No  pain behaviors are appreciated.  She has fairly good range of motion in  her lumbar spine, mild limitation with extension and flexion.  Cervical  rotation to the left is also compromised, she lacks about 10% of what  she has on the right side.  Shoulder range is full.  Reflexes in the  upper and lower extremities are symmetric and intact.  No sensory  deficits are appreciated.  Motor strength is good throughout both upper  and lower extremities.   MEDICATIONS:  Prescribed from this clinic include Flexeril 10 mg 1 p.o.  b.i.d. to q.d., hydrocodone 10/560 twice a day, Dulcolax tablets on a  p.r.n. basis and Lyrica 25 mg 2 in the morning 1 in the p.m. and 2 at  h.s.   IMPRESSION:  1. Cervicalgia with mild cervical degenerative disc disease.  2. Probable fibromyalgia overlay.  3. Lumbago with intermittent neuropathic lower extremity leg pain.  4. Hand stiffness over the last couple of months, somewhat  improved.   PLAN:  1. Consider sed rate HLA between 7.  Given her pain drawing today      there appears to be quite a bit of axial involvement more so than      peripheral.  2. Encouraged her to continue her community based exercise program.      If the right shoulder continues to bother her she may seek some      orthopedic care to evaluate this shoulder more fully.  We will      assess this over the next couple of months.  3. We will see her back in a month.  She has been stable on these      medications.  No abhorrent behavior has been observed.  She is      using her medications appropriately and is able to maintain and      improve her overall function with them.            ______________________________  Brantley Stage, M.D.     DMK/MedQ  D:  10/19/2006 11:05:13  T:  10/19/2006 11:58:03  Job #:  [id number]
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
## 2011-02-15 ENCOUNTER — Encounter (HOSPITAL_COMMUNITY): Payer: Self-pay

## 2011-02-15 ENCOUNTER — Ambulatory Visit (HOSPITAL_COMMUNITY)
Admission: RE | Admit: 2011-02-15 | Discharge: 2011-02-15 | Disposition: A | Discharge status: Home / Self Care | Payer: No Typology Code available for payment source | Source: Ambulatory Visit | Attending: Family Medicine | Admitting: Family Medicine

## 2011-02-15 ENCOUNTER — Other Ambulatory Visit (HOSPITAL_COMMUNITY): Payer: Self-pay | Admitting: Family Medicine

## 2011-02-15 DIAGNOSIS — M549 Dorsalgia, unspecified: Secondary | ICD-10-CM

## 2011-02-15 DIAGNOSIS — M546 Pain in thoracic spine: Secondary | ICD-10-CM | POA: Insufficient documentation
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
## 2013-12-27 DIAGNOSIS — M797 Fibromyalgia: Secondary | ICD-10-CM | POA: Insufficient documentation

## 2013-12-27 DIAGNOSIS — E039 Hypothyroidism, unspecified: Secondary | ICD-10-CM | POA: Insufficient documentation

## 2013-12-27 DIAGNOSIS — G43009 Migraine without aura, not intractable, without status migrainosus: Secondary | ICD-10-CM | POA: Insufficient documentation

## 2013-12-27 DIAGNOSIS — J309 Allergic rhinitis, unspecified: Secondary | ICD-10-CM | POA: Insufficient documentation

## 2018-09-13 HISTORY — PX: APPENDECTOMY: SHX54

## 2019-12-03 ENCOUNTER — Ambulatory Visit: Payer: Self-pay

## 2019-12-05 ENCOUNTER — Other Ambulatory Visit: Payer: Self-pay

## 2019-12-05 ENCOUNTER — Ambulatory Visit (INDEPENDENT_AMBULATORY_CARE_PROVIDER_SITE_OTHER): Payer: Self-pay | Admitting: Nurse Practitioner

## 2019-12-05 VITALS — BP 134/74 | HR 75 | Temp 97.5°F | Ht 71.0 in | Wt 243.0 lb

## 2019-12-05 DIAGNOSIS — R41840 Attention and concentration deficit: Secondary | ICD-10-CM

## 2019-12-05 DIAGNOSIS — B349 Viral infection, unspecified: Secondary | ICD-10-CM

## 2019-12-05 DIAGNOSIS — R413 Other amnesia: Secondary | ICD-10-CM

## 2019-12-05 DIAGNOSIS — M797 Fibromyalgia: Secondary | ICD-10-CM

## 2019-12-05 DIAGNOSIS — Z87898 Personal history of other specified conditions: Secondary | ICD-10-CM

## 2019-12-05 DIAGNOSIS — Z20822 Contact with and (suspected) exposure to covid-19: Secondary | ICD-10-CM

## 2019-12-05 NOTE — Progress Notes (Signed)
@Patient  ID: Courtney Patton, female    DOB: 03-01-1976, 44 y.o.   MRN: [id number]  Chief Complaint  Patient presents with  . Second Opinion    Per patient she was never tested for COVID but did have all Sx. Is currently have multiple ongoing sypmtoms.     Referring provider: Elsie Lincoln, MD  44 year old female with history of fibromyalgia, miraines, allergic rhinitis, and hypothyroidism.  HPI Patient presents today for post Covid care clinic visit.  Patient states that she has never actually tested positive for Covid.  Patient states that she was sick in February 2020.  She thinks that she had Covid at this time.  Patient complains of multiple ongoing symptoms since last February.  She complains of fatigue, ear pain, sinus congestion, cough, bloating, blurred vision, memory loss, anxiety, chest pain.  She has been to see multiple GI specialist and OB/GYN for abdominal discomfort and all test and exploratory surgeries were negative.  She states that her abdominal symptoms have recently started to subside.  Patient states that she does lead a sedentary lifestyle she does not exercise and did not exercise before February 2020.  Patient states that she has extreme fatigue during the day.  She states that she does snore at night.  She also complains of some tingling to hands and feet at times.Denies f/c/s, n/v/d, hemoptysis, PND, chest pain or edema.   Patient was walked in office today O2 sats and heart rate remained stable.     Allergies  Allergen Reactions  . Codeine Shortness Of Breath    Other reaction(s): Nervous  . Etodolac     Other reaction(s): Unknown  . Nsaids      There is no immunization history on file for this patient.  Past Medical History:  Diagnosis Date  . Anemia   . Arthritis   . Asthma   . Chronic fatigue   . Fibromyalgia   . GERD (gastroesophageal reflux disease)   . Thyroid disease     Tobacco History: Social History   Tobacco Use  Smoking Status  Never Smoker  Smokeless Tobacco Never Used   Counseling given: Not Answered   Outpatient Encounter Medications as of 12/05/2019  Medication Sig  . DULoxetine (CYMBALTA) 60 MG capsule Take 60 mg by mouth daily.  Marland Kitchen levothyroxine (SYNTHROID) 150 MCG tablet Take 150 mcg by mouth daily before breakfast.  . traMADol (ULTRAM) 50 MG tablet Take by mouth.  . cetirizine (ZYRTEC) 10 MG tablet Take by mouth.  . fluticasone (FLONASE) 50 MCG/ACT nasal spray Place into the nose.  . methocarbamol (ROBAXIN) 750 MG tablet Take by mouth.   No facility-administered encounter medications on file as of 12/05/2019.     Review of Systems  Review of Systems  Constitutional: Positive for activity change (decreased) and fatigue.  HENT: Negative.   Eyes: Positive for visual disturbance.  Respiratory: Positive for cough and shortness of breath.   Cardiovascular: Negative.  Negative for chest pain, palpitations and leg swelling.  Gastrointestinal: Negative.   Musculoskeletal: Positive for myalgias.  Allergic/Immunologic: Negative.   Neurological: Negative.   Psychiatric/Behavioral: Positive for decreased concentration.       Physical Exam  BP 134/74   Pulse 75   Temp (!) 97.5 F (36.4 C)   Ht 5' 11"  (1.803 m)   Wt 243 lb (110.2 kg)   SpO2 98%   BMI 33.89 kg/m   Wt Readings from Last 5 Encounters:  12/05/19 243 lb (110.2 kg)  Physical Exam Vitals and nursing note reviewed.  Constitutional:      General: She is not in acute distress.    Appearance: She is well-developed.  Cardiovascular:     Rate and Rhythm: Normal rate and regular rhythm.  Pulmonary:     Effort: Pulmonary effort is normal.     Breath sounds: Normal breath sounds. No wheezing or rhonchi.  Musculoskeletal:     Right lower leg: No edema.     Left lower leg: No edema.  Neurological:     Mental Status: She is alert and oriented to person, place, and time.       Assessment & Plan:   Viral illness Patient  states that she had a viral illness in February 2020. She has had a myriad of symptoms since that time. All GI/OB/GYN work-up has been normal.   Plan:  Will check labs today - including covid antibody test  Hand out given on post covid nutritional rehabilitation  Stay active  Start either gentle flow yoga or water aerobics at least 3 times per week  Stay well hydrated  Will refer to neurology - memory loss, and concern for sleep apnea  Follow up in 1 month  Number for financial assistance given to patient       Fenton Foy, NP 12/06/2019

## 2019-12-05 NOTE — Patient Instructions (Signed)
Will check labs today  Hand out given on post covid nutritional rehabilitation  Stay active  Start either gentle flow yoga or water aerobics at least 3 times per week  Stay well hydrated  Will refer to neurology - memory loss, and concern for sleep apnea  Follow up in 1 month  Number for financial assistance given to patient   Fatigue If you have fatigue, you feel tired all the time and have a lack of energy or a lack of motivation. Fatigue may make it difficult to start or complete tasks because of exhaustion. In general, occasional or mild fatigue is often a normal response to activity or life. However, long-lasting (chronic) or extreme fatigue may be a symptom of a medical condition. Follow these instructions at home: General instructions  Watch your fatigue for any changes.  Go to bed and get up at the same time every day.  Avoid fatigue by pacing yourself during the day and getting enough sleep at night.  Maintain a healthy weight. Medicines  Take over-the-counter and prescription medicines only as told by your health care provider.  Take a multivitamin, if told by your health care provider.  Do not use herbal or dietary supplements unless they are approved by your health care provider. Activity   Exercise regularly, as told by your health care provider.  Use or practice techniques to help you relax, such as yoga, tai chi, meditation, or massage therapy. Eating and drinking   Avoid heavy meals in the evening.  Eat a well-balanced diet, which includes lean proteins, whole grains, plenty of fruits and vegetables, and low-fat dairy products.  Avoid consuming too much caffeine.  Avoid the use of alcohol.  Drink enough fluid to keep your urine pale yellow. Lifestyle  Change situations that cause you stress. Try to keep your work and personal schedule in balance.  Do not use any products that contain nicotine or tobacco, such as cigarettes and e-cigarettes.  If you need help quitting, ask your health care provider.  Do not use drugs. Contact a health care provider if:  Your fatigue does not get better.  You have a fever.  You suddenly lose or gain weight.  You have headaches.  You have trouble falling asleep or sleeping through the night.  You feel angry, guilty, anxious, or sad.  You are unable to have a bowel movement (constipation).  Your skin is dry.  You have swelling in your legs or another part of your body. Get help right away if:  You feel confused.  Your vision is blurry.  You feel faint or you pass out.  You have a severe headache.  You have severe pain in your abdomen, your back, or the area between your waist and hips (pelvis).  You have chest pain, shortness of breath, or an irregular or fast heartbeat.  You are unable to urinate, or you urinate less than normal.  You have abnormal bleeding, such as bleeding from the rectum, vagina, nose, lungs, or nipples.  You vomit blood.  You have thoughts about hurting yourself or others. If you ever feel like you may hurt yourself or others, or have thoughts about taking your own life, get help right away. You can go to your nearest emergency department or call:  Your local emergency services (911 in the [place]  A suicide crisis helpline, such as the [facility] at 727-469-8439. This is open 24 hours a day. Summary  If you have fatigue, you feel  tired all the time and have a lack of energy or a lack of motivation.  Fatigue may make it difficult to start or complete tasks because of exhaustion.  Long-lasting (chronic) or extreme fatigue may be a symptom of a medical condition.  Exercise regularly, as told by your health care provider.  Change situations that cause you stress. Try to keep your work and personal schedule in balance. This information is not intended to replace advice given to you by your health care provider. Make sure  you discuss any questions you have with your health care provider. Document Revised: 03/21/2019 Document Reviewed: 05/25/2017 Elsevier Patient Education  2020 [employer].

## 2019-12-06 DIAGNOSIS — R413 Other amnesia: Secondary | ICD-10-CM | POA: Insufficient documentation

## 2019-12-06 DIAGNOSIS — B349 Viral infection, unspecified: Secondary | ICD-10-CM | POA: Insufficient documentation

## 2019-12-06 DIAGNOSIS — R41840 Attention and concentration deficit: Secondary | ICD-10-CM | POA: Insufficient documentation

## 2019-12-06 DIAGNOSIS — Z20822 Contact with and (suspected) exposure to covid-19: Secondary | ICD-10-CM | POA: Insufficient documentation

## 2019-12-06 DIAGNOSIS — Z87898 Personal history of other specified conditions: Secondary | ICD-10-CM | POA: Insufficient documentation

## 2019-12-06 NOTE — Assessment & Plan Note (Signed)
Patient states that she had a viral illness in February 2020. She has had a myriad of symptoms since that time. All GI/OB/GYN work-up has been normal.   Plan:  Will check labs today - including covid antibody test  Hand out given on post covid nutritional rehabilitation  Stay active  Start either gentle flow yoga or water aerobics at least 3 times per week  Stay well hydrated  Will refer to neurology - memory loss, and concern for sleep apnea  Follow up in 1 month  Number for financial assistance given to patient

## 2019-12-07 LAB — RHEUMATOID ARTHRITIS PROFILE
Cyclic Citrullin Peptide Ab: 14 units (ref 0–19)
Rhuematoid fact SerPl-aCnc: 12.1 IU/mL (ref 0.0–13.9)

## 2019-12-07 LAB — CBC
Hematocrit: 39.1 % (ref 34.0–46.6)
Hemoglobin: 13.2 g/dL (ref 11.1–15.9)
MCH: 28.1 pg (ref 26.6–33.0)
MCHC: 33.8 g/dL (ref 31.5–35.7)
MCV: 83 fL (ref 79–97)
Platelets: 375 10*3/uL (ref 150–450)
RBC: 4.69 x10E6/uL (ref 3.77–5.28)
RDW: 17.2 % — ABNORMAL HIGH (ref 11.7–15.4)
WBC: 8.5 10*3/uL (ref 3.4–10.8)

## 2019-12-07 LAB — COMPREHENSIVE METABOLIC PANEL
ALT: 23 IU/L (ref 0–32)
AST: 20 IU/L (ref 0–40)
Albumin/Globulin Ratio: 1.8 (ref 1.2–2.2)
Albumin: 4.4 g/dL (ref 3.8–4.8)
Alkaline Phosphatase: 74 IU/L (ref 39–117)
BUN/Creatinine Ratio: 9 (ref 9–23)
BUN: 7 mg/dL (ref 6–24)
Bilirubin Total: 0.3 mg/dL (ref 0.0–1.2)
CO2: 24 mmol/L (ref 20–29)
Calcium: 10 mg/dL (ref 8.7–10.2)
Chloride: 103 mmol/L (ref 96–106)
Creatinine, Ser: 0.81 mg/dL (ref 0.57–1.00)
GFR calc Af Amer: 103 mL/min/{1.73_m2} (ref >59)
GFR calc non Af Amer: 89 mL/min/{1.73_m2} (ref >59)
Globulin, Total: 2.5 g/dL (ref 1.5–4.5)
Glucose: 89 mg/dL (ref 65–99)
Potassium: 4.8 mmol/L (ref 3.5–5.2)
Sodium: 142 mmol/L (ref 134–144)
Total Protein: 6.9 g/dL (ref 6.0–8.5)

## 2019-12-07 LAB — SEDIMENTATION RATE: Sed Rate: 57 mm/hr — ABNORMAL HIGH (ref 0–32)

## 2019-12-07 LAB — VITAMIN D 25 HYDROXY (VIT D DEFICIENCY, FRACTURES): Vit D, 25-Hydroxy: 16.8 ng/mL — ABNORMAL LOW (ref 30.0–100.0)

## 2019-12-07 LAB — ANA W/REFLEX IF POSITIVE: Anti Nuclear Antibody (ANA): NEGATIVE

## 2019-12-07 LAB — SAR COV2 SEROLOGY (COVID19)AB(IGG),IA: DiaSorin SARS-CoV-2 Ab, IgG: NEGATIVE

## 2020-01-07 ENCOUNTER — Ambulatory Visit: Payer: Self-pay

## 2021-10-19 ENCOUNTER — Other Ambulatory Visit: Payer: Self-pay

## 2021-10-19 ENCOUNTER — Emergency Department (HOSPITAL_COMMUNITY)
Admission: EM | Admit: 2021-10-19 | Discharge: 2021-10-19 | Disposition: A | Discharge status: Home / Self Care | Payer: Self-pay | Attending: Emergency Medicine | Admitting: Emergency Medicine

## 2021-10-19 ENCOUNTER — Encounter (HOSPITAL_COMMUNITY): Payer: Self-pay

## 2021-10-19 DIAGNOSIS — Z79899 Other long term (current) drug therapy: Secondary | ICD-10-CM | POA: Insufficient documentation

## 2021-10-19 DIAGNOSIS — E039 Hypothyroidism, unspecified: Secondary | ICD-10-CM | POA: Insufficient documentation

## 2021-10-19 DIAGNOSIS — K645 Perianal venous thrombosis: Secondary | ICD-10-CM | POA: Insufficient documentation

## 2021-10-19 MED ORDER — POLYETHYLENE GLYCOL 3350 17 G PO PACK: 17.0000 g | PACK | Freq: Every day | ORAL | 0 refills | Status: AC

## 2021-10-19 MED ORDER — LIDOCAINE-EPINEPHRINE (PF) 2 %-1:200000 IJ SOLN
20.0000 mL | Freq: Once | INTRAMUSCULAR | Status: AC
  Administered 2021-10-19: 20 mL
  Filled 2021-10-19: qty 20

## 2021-10-19 MED ORDER — POVIDONE-IODINE 10 % EX SOLN
CUTANEOUS | Status: AC
  Filled 2021-10-19: qty 14.8

## 2021-10-19 NOTE — ED Provider Notes (Signed)
[facility] EMERGENCY DEPARTMENT Provider Note   CSN: [id number] Arrival date & time: 10/19/21  1041     History  No chief complaint on file.   Courtney Patton is a 46 y.o. female.  Past medical history of fibromyalgia, hypothyroidism. She presents with rectal pain for 5 days that has been gradually worsening.  She states that she has a history of constipation and has had problems with a bulge intermittently coming in and out of her rectum for about 3 and half years.  She normally is able to push this back then or it will reduce on its own.  Since Wednesday, she has been unable to do this and has gotten progressively more painful and hard.  She also states that it has been bleeding.  HPI     Home Medications Prior to Admission medications   Medication Sig Start Date End Date Taking? Authorizing Provider  polyethylene glycol (MIRALAX) 17 g packet Take 17 g by mouth daily for 14 days. 10/19/21 11/02/21 Yes Braedyn Kauk, Finis Bud, PA-C  cetirizine (ZYRTEC) 10 MG tablet Take by mouth.    [provider]  DULoxetine (CYMBALTA) 60 MG capsule Take 60 mg by mouth daily.    [provider]  fluticasone (FLONASE) 50 MCG/ACT nasal spray Place into the nose.    [provider]  levothyroxine (SYNTHROID) 150 MCG tablet Take 150 mcg by mouth daily before breakfast.    [provider]  methocarbamol (ROBAXIN) 750 MG tablet Take by mouth.    [provider]  traMADol (ULTRAM) 50 MG tablet Take by mouth. 08/19/14   [provider]      Allergies    Codeine, Etodolac, and Nsaids    Review of Systems   Review of Systems  Gastrointestinal:  Positive for anal bleeding and rectal pain.  All other systems reviewed and are negative.  Physical Exam Updated Vital Signs BP 113/72 (BP Location: Right Arm)    Pulse 95    Temp 98 F (36.7 C) (Oral)    Resp 18    Ht 5\' 11"  (1.803 m)    Wt 113.4 kg    SpO2 96%    BMI 34.87 kg/m  Physical Exam Vitals and nursing  note reviewed. Exam conducted with a chaperone present.  Constitutional:      General: She is not in acute distress.    Appearance: Normal appearance. She is well-developed. She is not ill-appearing, toxic-appearing or diaphoretic.  HENT:     Head: Normocephalic and atraumatic.     Nose: No nasal deformity.     Mouth/Throat:     Lips: Pink. No lesions.  Eyes:     General: Gaze aligned appropriately. No scleral icterus.       Right eye: No discharge.        Left eye: No discharge.     Conjunctiva/sclera: Conjunctivae normal.     Right eye: Right conjunctiva is not injected. No exudate or hemorrhage.    Left eye: Left conjunctiva is not injected. No exudate or hemorrhage. Pulmonary:     Effort: Pulmonary effort is normal. No respiratory distress.  Genitourinary:    Rectum: Tenderness and external hemorrhoid present. No mass, anal fissure or internal hemorrhoid.     Comments: There is a large external hemorrhoid present.  It is very tender and firm to the touch.  It is mildly erythematous.  It is nonreducible. Internal rectum with normal tone, no hemorrhoids present.  No anal fissure.  It is  painful for her.  Obvious rectal bleeding on exam. Skin:    General: Skin is warm and dry.  Neurological:     Mental Status: She is alert and oriented to person, place, and time.  Psychiatric:        Mood and Affect: Mood normal.        Speech: Speech normal.        Behavior: Behavior normal. Behavior is cooperative.    ED Results / Procedures / Treatments   Labs (all labs ordered are listed, but only abnormal results are displayed) Labs Reviewed - No data to display  EKG None  Radiology No results found.  Procedures .Marland KitchenIncision and Drainage  Date/Time: 10/19/2021 11:29 PM Performed by: Claudie Leach, PA-C Authorized by: Claudie Leach, PA-C   Consent:    Consent obtained:  Verbal   Consent given by:  Patient   Risks, benefits, and alternatives were discussed: yes     Risks  discussed:  Bleeding, incomplete drainage, pain, damage to other organs and infection   Alternatives discussed:  No treatment Universal protocol:    Procedure explained and questions answered to patient or proxy's satisfaction: yes     Patient identity confirmed:  Verbally with patient Location:    Type:  External thrombosed hemorrhoid   Location:  Anogenital   Anogenital location:  Perianal Pre-procedure details:    Skin preparation:  Povidone-iodine Anesthesia:    Anesthesia method:  Local infiltration   Local anesthetic:  Lidocaine 2% WITH epi Procedure type:    Complexity:  Simple Procedure details:    Incision types:  Single straight   Wound management:  Probed and deloculated   Drainage:  Bloody   Drainage amount:  Moderate   Wound treatment:  Wound left open   Packing materials:  None Post-procedure details:    Procedure completion:  Tolerated   Medications Ordered in ED Medications  lidocaine-EPINEPHrine (XYLOCAINE W/EPI) 2 %-1:200000 (PF) injection 20 mL (20 mLs Infiltration Given 10/19/21 1254)  povidone-iodine (BETADINE) 10 % external solution (  Given 10/19/21 1254)    ED Course/ Medical Decision Making/ A&P                           Medical Decision Making Problems Addressed: Thrombosed hemorrhoids: complicated acute illness or injury    Details: thrombosed hemhoroid requiring drainage  Risk Prescription drug management.   This is a 46 y.o. female with a PMH of fibromyalgia and hypothyroidism who presents to the ED with rectal pain.  Exam consistent with thrombosed hemorrhoid. It does not appear strangulated or incarcerated. Will proceed with I and D. Procedure was performed and large blood clot was removed. There was not a large amount of bleeding.  Patient should follow up with general surgery and constipation instructions provided. Stool softener for two weeks.   I have seen and evaluated this patient in conjunction with my attending physician who agrees  and has made changes to the plan accordingly.  Portions of this note were generated with Scientist, clinical (histocompatibility and immunogenetics). Dictation errors may occur despite best attempts at proofreading.   Final Clinical Impression(s) / ED Diagnoses Final diagnoses:  Thrombosed hemorrhoids    Rx / DC Orders ED Discharge Orders          Ordered    polyethylene glycol (MIRALAX) 17 g packet  Daily        10/19/21 1252  Therese Sarah 10/19/21 2331    Bethann Berkshire, MD 10/20/21 [phone]

## 2021-10-19 NOTE — ED Triage Notes (Signed)
Patient states that she has a bulge sticking out of her anus that continues to bleed. States that every 30 minutes there is a squeezing sensation.  States that she has chronic constipation.

## 2021-10-19 NOTE — Discharge Instructions (Addendum)
Please read care instructions in your discharge paperwork. You should take daily stool softeners for two weeks following this procedure.  You should follow up with general surgery in one week. Dr. Elta Guadeloupe Jenkin's information is included in your paperwork. Please call and set up an appointment.

## 2021-10-27 ENCOUNTER — Encounter: Payer: Self-pay | Admitting: General Surgery

## 2021-10-27 ENCOUNTER — Other Ambulatory Visit: Payer: Self-pay

## 2021-10-27 ENCOUNTER — Ambulatory Visit: Payer: Self-pay | Admitting: General Surgery

## 2021-10-27 VITALS — BP 122/85 | HR 83 | Temp 98.6°F | Resp 14 | Ht 71.0 in | Wt 250.0 lb

## 2021-10-27 DIAGNOSIS — K645 Perianal venous thrombosis: Secondary | ICD-10-CM

## 2021-10-27 MED ORDER — HYDROCORTISONE 1 % EX CREA: TOPICAL_CREAM | CUTANEOUS | 1 refills | Status: AC

## 2021-10-27 NOTE — Progress Notes (Signed)
Courtney Patton; [id number]; 12-04-1975   HPI Patient is a 46 year old white female who was referred to my care by the emergency room for follow-up of a thrombosed hemorrhoid.  She was seen in the emergency room on 10/19/2021.  She was noted to have an external thrombosed hemorrhoid and it was evacuated in the emergency room.  She states she has had a longstanding history of hemorrhoidal disease.  This was the first time it really flared up.  Since then, she has not had significant rectal pain or bleeding.  She has been using Tucks wipes.  She states is still a little swollen but does not want surgery unless absolutely necessary.  Patient does suffer from IBS, primarily with constipation, though she is having frequent bowel movements lately. Past Medical History:  Diagnosis Date   Anemia    Arthritis    Asthma    Chronic fatigue    Fibromyalgia    GERD (gastroesophageal reflux disease)    Thyroid disease     Past Surgical History:  Procedure Laterality Date   COLONOSCOPY     CYSTOSCOPY     HYSTEROSCOPY WITH D & C     LAPAROSCOPY     Orthoscopy      Family History  Problem Relation Age of Onset   Cancer Mother    COPD Mother    Cancer Maternal Aunt    Diabetes Maternal Uncle    Cancer Other     Current Outpatient Medications on File Prior to Visit  Medication Sig Dispense Refill   cetirizine (ZYRTEC) 10 MG tablet Take by mouth.     DULoxetine (CYMBALTA) 60 MG capsule Take 60 mg by mouth daily.     fluticasone (FLONASE) 50 MCG/ACT nasal spray Place into the nose.     levothyroxine (SYNTHROID) 150 MCG tablet Take 150 mcg by mouth daily before breakfast.     methocarbamol (ROBAXIN) 750 MG tablet Take by mouth.     polyethylene glycol (MIRALAX) 17 g packet Take 17 g by mouth daily for 14 days. 14 each 0   traMADol (ULTRAM) 50 MG tablet Take by mouth.     No current facility-administered medications on file prior to visit.    Allergies  Allergen Reactions   Codeine Shortness Of  Breath    Other reaction(s): Nervous   Etodolac     Other reaction(s): Unknown   Nsaids     Social History   Substance and Sexual Activity  Alcohol Use Never    Social History   Tobacco Use  Smoking Status Never  Smokeless Tobacco Never    Review of Systems  Constitutional:  Positive for malaise/fatigue.  HENT:  Positive for sinus pain.   Eyes:  Positive for blurred vision.  Respiratory:  Positive for cough and shortness of breath.   Cardiovascular: Negative.   Gastrointestinal:  Positive for heartburn and nausea.  Genitourinary:  Positive for frequency.  Musculoskeletal:  Positive for back pain, joint pain and neck pain.  Skin: Negative.   Endo/Heme/Allergies: Negative.   Psychiatric/Behavioral: Negative.     Objective   Vitals:   10/27/21 1406  BP: 122/85  Pulse: 83  Resp: 14  Temp: 98.6 F (37 C)  SpO2: 98%    Physical Exam Vitals reviewed.  Constitutional:      Appearance: Normal appearance. She is not ill-appearing.  HENT:     Head: Normocephalic and atraumatic.  Cardiovascular:     Rate and Rhythm: Normal rate and regular rhythm.  Heart sounds: Normal heart sounds. No murmur heard.   No friction rub. No gallop.  Pulmonary:     Effort: Pulmonary effort is normal. No respiratory distress.     Breath sounds: Normal breath sounds. No stridor. No wheezing, rhonchi or rales.  Genitourinary:    Comments: Rectal examination reveals a healing external hemorrhoid at the 7 o'clock position.  No clot is present.  No active bleeding is noted. Skin:    General: Skin is warm and dry.  Neurological:     Mental Status: She is alert and oriented to person, place, and time.   ER notes reviewed Assessment  Thrombosed external hemorrhoid, resolving.  No need for acute surgical invention at this time. Plan  Proctofoam HC cream has been prescribed twice a day to be applied to the hemorrhoid for the next 2 weeks.  Patient states she is moving out of the region.   Literature about hemorrhoids was given.  Follow-up as needed.

## 2023-12-19 ENCOUNTER — Ambulatory Visit (HOSPITAL_COMMUNITY)
Admission: RE | Admit: 2023-12-19 | Discharge: 2023-12-19 | Disposition: A | Discharge status: Home / Self Care | Source: Ambulatory Visit | Attending: Nurse Practitioner | Admitting: Nurse Practitioner

## 2023-12-19 ENCOUNTER — Ambulatory Visit: Admission: EM | Admit: 2023-12-19 | Discharge: 2023-12-19 | Disposition: A | Discharge status: Home / Self Care

## 2023-12-19 DIAGNOSIS — M545 Low back pain, unspecified: Secondary | ICD-10-CM

## 2023-12-19 DIAGNOSIS — M50322 Other cervical disc degeneration at C5-C6 level: Secondary | ICD-10-CM | POA: Diagnosis not present

## 2023-12-19 DIAGNOSIS — M542 Cervicalgia: Secondary | ICD-10-CM | POA: Diagnosis not present

## 2023-12-19 DIAGNOSIS — M5386 Other specified dorsopathies, lumbar region: Secondary | ICD-10-CM | POA: Insufficient documentation

## 2023-12-19 DIAGNOSIS — M4604 Spinal enthesopathy, thoracic region: Secondary | ICD-10-CM | POA: Diagnosis not present

## 2023-12-19 DIAGNOSIS — G8929 Other chronic pain: Secondary | ICD-10-CM

## 2023-12-19 DIAGNOSIS — M546 Pain in thoracic spine: Secondary | ICD-10-CM | POA: Diagnosis present

## 2023-12-19 LAB — POCT URINALYSIS DIP (MANUAL ENTRY)
Bilirubin, UA: NEGATIVE
Glucose, UA: NEGATIVE mg/dL
Ketones, POC UA: NEGATIVE mg/dL
Leukocytes, UA: NEGATIVE
Nitrite, UA: NEGATIVE
Protein Ur, POC: NEGATIVE mg/dL
Spec Grav, UA: 1.01
Urobilinogen, UA: 0.2 U/dL
pH, UA: 5.5

## 2023-12-19 MED ORDER — METHYLPREDNISOLONE 4 MG PO TBPK: ORAL_TABLET | ORAL | 0 refills | Status: AC

## 2023-12-19 NOTE — ED Provider Notes (Signed)
 [facility]    CSN: [id number] Arrival date & time: 12/19/23  1452      History   Chief Complaint Chief Complaint  Patient presents with   Neck Pain    HPI Courtney Patton is a 48 y.o. female.   HPI  She is in today for evaluation of neck middle and low back pain.  She endorses that she has a history of ankylosing spondylosis.  She endorses that she has flareups on occasion.  She reports that she previous resident at [place] currently living here with her mother due to previous job loss.  She reports that she does continue to follow-up with her providers in [place].  She reports that she is having a flareup.  She feels like this is related to taking pictures on Friday using her camera.  She reports that the smallest movements can cause flareups. Past Medical History:  Diagnosis Date   Anemia    Arthritis    Asthma    Chronic fatigue    Fibromyalgia    GERD (gastroesophageal reflux disease)    Thyroid disease     Patient Active Problem List   Diagnosis Date Noted   Exposure to COVID-19 virus 12/06/2019   Viral illness 12/06/2019   Memory loss 12/06/2019   Concentration deficit 12/06/2019   History of snoring 12/06/2019   Allergic rhinitis 12/27/2013   Fibromyalgia 12/27/2013   Hypothyroidism 12/27/2013   Migraine headache without aura 12/27/2013    Past Surgical History:  Procedure Laterality Date   APPENDECTOMY  2020   COLONOSCOPY     CYSTOSCOPY     HYSTEROSCOPY WITH D & C     LAPAROSCOPY     Orthoscopy      OB History   No obstetric history on file.      Home Medications    Prior to Admission medications   Medication Sig Start Date End Date Taking? Authorizing Provider  apixaban (ELIQUIS) 5 MG TABS tablet  12/27/22  Yes [provider]  cetirizine (ZYRTEC) 10 MG tablet Take by mouth.   Yes [provider]  diazepam (VALIUM) 5 MG tablet    Yes [provider]  DULoxetine (CYMBALTA) 60 MG capsule Take 60  mg by mouth daily.   Yes [provider]  esomeprazole (NEXIUM) 20 MG capsule    Yes [provider]  etanercept (ENBREL SURECLICK) 50 MG/ML injection  12/28/22  Yes [provider]  levothyroxine (SYNTHROID) 150 MCG tablet Take 150 mcg by mouth daily before breakfast.   Yes [provider]  methocarbamol (ROBAXIN) 750 MG tablet Take by mouth.   Yes [provider]  methylPREDNISolone (MEDROL) 4 MG TBPK tablet Follow package instructions. 12/19/23 12/25/23 Yes Barbette Merino, NP  traMADol (ULTRAM) 50 MG tablet Take by mouth. 08/19/14  Yes [provider]  fluticasone (FLONASE) 50 MCG/ACT nasal spray Place into the nose.    [provider]    Family History Family History  Problem Relation Age of Onset   Cancer Mother    COPD Mother    Cancer Maternal Aunt    Diabetes Maternal Uncle    Cancer Other     Social History Social History   Tobacco Use   Smoking status: Never   Smokeless tobacco: Never  Vaping Use   Vaping status: Never Used  Substance Use Topics   Alcohol use: Never   Drug use: Never     Allergies   Codeine, Etodolac, and  Nsaids   Review of Systems Review of Systems   Physical Exam Triage Vital Signs ED Triage Vitals [12/19/23 1515]  Encounter Vitals Group     BP (!) 139/91     Systolic BP Percentile      Diastolic BP Percentile      Pulse Rate (!) 121     Resp      Temp 98.1 F (36.7 C)     Temp Source Oral     SpO2      Weight      Height      Head Circumference      Peak Flow      Pain Score      Pain Loc      Pain Education      Exclude from Growth Chart    No data found.  Updated Vital Signs BP (!) 139/91   Pulse (!) 121   Temp 98.1 F (36.7 C) (Oral)   LMP 12/05/2023 (Exact Date)   Visual Acuity Right Eye Distance:   Left Eye Distance:   Bilateral Distance:    Right Eye Near:   Left Eye Near:    Bilateral Near:     Physical Exam Constitutional:       Appearance: She is obese.  HENT:     Head: Normocephalic.     Mouth/Throat:     Mouth: Mucous membranes are dry.  Cardiovascular:     Rate and Rhythm: Tachycardia present.  Pulmonary:     Effort: Pulmonary effort is normal.  Musculoskeletal:     Cervical back: Deformity present.     Thoracic back: Deformity present.     Lumbar back: Tenderness present. Normal range of motion.  Skin:    General: Skin is warm and dry.  Neurological:     Mental Status: She is alert.      UC Treatments / Results  Labs (all labs ordered are listed, but only abnormal results are displayed) Labs Reviewed  POCT URINALYSIS DIP (MANUAL ENTRY)    EKG   Radiology No results found.  Procedures Procedures (including critical care time)  Medications Ordered in UC Medications - No data to display  Initial Impression / Assessment and Plan / UC Course  I have reviewed the triage vital signs and the nursing notes.  Pertinent labs & imaging results that were available during my care of the patient were reviewed by me and considered in my medical decision making (see chart for details).     Neck pain Final Clinical Impressions(s) / UC Diagnoses   Final diagnoses:  Trigger point with neck pain  Right-sided thoracic back pain, unspecified chronicity  Chronic bilateral low back pain, unspecified whether sciatica present     Discharge Instructions      You have been diagnosed with flareup of ankylosing spondylosis.  You have been prescribed a Medrol Dosepak to take as directed.  You have order for images to be completed at [facility].  You are encouraged to follow-up with your pain management provider along with rheumatologist for further evaluation if symptoms persist.     ED Prescriptions     Medication Sig Dispense Auth. Provider   methylPREDNISolone (MEDROL) 4 MG TBPK tablet Follow package instructions. 21 tablet Barbette Merino, NP      PDMP not reviewed this encounter.    Thad Ranger [place], [place] 12/19/23 [phone]

## 2023-12-19 NOTE — ED Triage Notes (Signed)
 Neck pain on right side, pt states he is having shooting pain in her neck that goes down to her shoulder, and down to spine, numbness to right hand x 2 days. Using heat and ice does help with the pain. Taking tramadol.

## 2023-12-19 NOTE — Discharge Instructions (Addendum)
 You have been diagnosed with flareup of ankylosing spondylosis.  You have been prescribed a Medrol Dosepak to take as directed.  You have order for images to be completed at [facility].  You are encouraged to follow-up with your pain management provider along with rheumatologist for further evaluation if symptoms persist.

## 2024-01-05 ENCOUNTER — Ambulatory Visit: Admitting: Obstetrics & Gynecology

## 2024-01-25 ENCOUNTER — Encounter: Payer: Self-pay | Admitting: Obstetrics & Gynecology

## 2024-01-25 ENCOUNTER — Other Ambulatory Visit (HOSPITAL_COMMUNITY)
Admission: RE | Admit: 2024-01-25 | Discharge: 2024-01-25 | Disposition: A | Discharge status: Home / Self Care | Source: Ambulatory Visit | Attending: Obstetrics & Gynecology | Admitting: Obstetrics & Gynecology

## 2024-01-25 ENCOUNTER — Encounter (INDEPENDENT_AMBULATORY_CARE_PROVIDER_SITE_OTHER): Payer: Self-pay

## 2024-01-25 ENCOUNTER — Ambulatory Visit: Admitting: Obstetrics & Gynecology

## 2024-01-25 VITALS — BP 121/82 | HR 108 | Ht 70.0 in | Wt 266.0 lb

## 2024-01-25 DIAGNOSIS — N920 Excessive and frequent menstruation with regular cycle: Secondary | ICD-10-CM

## 2024-01-25 DIAGNOSIS — Z1151 Encounter for screening for human papillomavirus (HPV): Secondary | ICD-10-CM | POA: Insufficient documentation

## 2024-01-25 DIAGNOSIS — Z1231 Encounter for screening mammogram for malignant neoplasm of breast: Secondary | ICD-10-CM

## 2024-01-25 DIAGNOSIS — R300 Dysuria: Secondary | ICD-10-CM

## 2024-01-25 DIAGNOSIS — Z01419 Encounter for gynecological examination (general) (routine) without abnormal findings: Secondary | ICD-10-CM | POA: Diagnosis not present

## 2024-01-25 DIAGNOSIS — Z7901 Long term (current) use of anticoagulants: Secondary | ICD-10-CM | POA: Diagnosis not present

## 2024-01-25 LAB — POCT URINALYSIS DIPSTICK OB
Blood, UA: NEGATIVE
Glucose, UA: NEGATIVE
Ketones, UA: NEGATIVE
Leukocytes, UA: NEGATIVE
Nitrite, UA: NEGATIVE
POC,PROTEIN,UA: NEGATIVE

## 2024-01-25 NOTE — Progress Notes (Addendum)
 WELL-WOMAN EXAMINATION Patient name: Courtney Patton MRN [id number]  Date of birth: [birth year] Chief Complaint:   Annual Exam  History of Present Illness:   Courtney Patton is a 48 y.o. G0P0000 female being seen today for a routine well-woman exam.   Unable to put in tampon since Sept.  Feels like she is "hitting a wall."  Menses are every month- typically 7 days on occasion may be longer.  Menses are very heavy- may soak through in an hour or two.  On occasion may have an accident due to the bleeding.  Typically does not have intermenstrual bleeding.    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 Denies issues with her menses The current method of family planning is abstinence.   Notes h/o hysteroscopy, D&C and ?endometriosis.  States the endometriosis was diagnosed by GI- possibly during appendectomy.  Tried Depot- noted weight loss, severe headache and significant side effects   Last pap due today.  Last mammogram: ordered. Last colonoscopy: 2020     01/25/2024   10:56 AM 12/05/2019    1:34 PM  Depression screen PHQ 2/9  Decreased Interest 1 2  Down, Depressed, Hopeless 0 0  PHQ - 2 Score 1 2  Altered sleeping 3   Tired, decreased energy 3   Change in appetite 1   Feeling bad or failure about yourself  1   Trouble concentrating 3   Moving slowly or fidgety/restless 1   Suicidal thoughts 0   PHQ-9 Score 13       Review of Systems:   Pertinent items are noted in HPI Main focus on GYN issues Pertinent History Reviewed:  Reviewed past medical,surgical, social and family history.  Reviewed problem list, medications and allergies. Physical Assessment:   Vitals:   01/25/24 1013  BP: 121/82  Pulse: (!) 108  Weight: 266 lb (120.7 kg)  Height: 5\' 10"  (1.778 m)  Body mass index is 38.17 kg/m.        Physical  Examination:   General appearance - well appearing, and in no distress  Mental status - alert, oriented to person, place, and time  Psych:  She has a normal mood and affect  Skin - warm and dry, normal color, no suspicious lesions noted  Chest - effort normal, all lung fields clear to auscultation bilaterally  Heart - normal rate and regular rhythm  Neck:  midline trachea, no thyromegaly or nodules  Breasts - breasts appear normal, no suspicious masses, no skin or nipple changes or  axillary nodes  Abdomen -obese, soft, nontender, nondistended, no masses or organomegaly- no reproducible pain  Pelvic - VULVA: normal appearing vulva with no masses, tenderness or lesions  VAGINA: normal appearing vagina with normal color and discharge, no lesions  CERVIX: normal appearing cervix without discharge or lesions, no CMT.  Some difficulty with visualization due to patient discomfort  Thin prep pap is done with HR HPV cotesting  UTERUS: uterus is felt to be normal size, shape, consistency and nontender   ADNEXA: No adnexal masses or tenderness noted.  Exam limited due to body habitus  Extremities:  No swelling or varicosities noted, no calf tenderness bilaterally  Chaperone: Wendell Halt     Assessment & Plan:  1) Well-Woman Exam -pap collected, reviewed screening guidelines -mammogram ordered  2) HMB -plan for lab work to r/o anemia -will  also plan for pelvic US   -due to h/o PE, would limit options to progesterone only -Discussed options including POPs, IUD or surgical intervention.  Previously tried Depot with significant side effects []  plan to review further at next visit once US  is completed  3) h/o fibromyalgia, long COVID, PE -discussed these as contributing factors to some of her symptoms -no specific abnormalities noted on exam to explain pain or issues with tampons -questions/concerns were addressed and pt agreeable to above plan   ADDENDUM: records obtained.  Prior surgery:  Hysteroscopy, D&C, LEEP- 05/15/2019 due to AUB  Orders Placed This Encounter  Procedures   MM 3D SCREENING MAMMOGRAM BILATERAL BREAST   US  PELVIC COMPLETE WITH TRANSVAGINAL   Iron, TIBC and Ferritin Panel   CBC   POC Urinalysis Dipstick OB    Meds: No orders of the defined types were placed in this encounter.   Follow-up: Return for pelvic US /follow up with me after.  Nykolas Bacallao, DO Attending Obstetrician & Gynecologist, [facility] for [employer], [facility]

## 2024-01-26 ENCOUNTER — Ambulatory Visit: Payer: Self-pay | Admitting: Obstetrics & Gynecology

## 2024-01-26 LAB — IRON,TIBC AND FERRITIN PANEL
Ferritin: 12 ng/mL — ABNORMAL LOW (ref 15–150)
Iron Saturation: 18 % (ref 15–55)
Iron: 62 ug/dL (ref 27–159)
Total Iron Binding Capacity: 336 ug/dL (ref 250–450)
UIBC: 274 ug/dL (ref 131–425)

## 2024-01-26 LAB — CBC
Hematocrit: 37.6 % (ref 34.0–46.6)
Hemoglobin: 12.1 g/dL (ref 11.1–15.9)
MCH: 28.3 pg (ref 26.6–33.0)
MCHC: 32.2 g/dL (ref 31.5–35.7)
MCV: 88 fL (ref 79–97)
Platelets: 402 10*3/uL (ref 150–450)
RBC: 4.28 x10E6/uL (ref 3.77–5.28)
RDW: 13.1 % (ref 11.7–15.4)
WBC: 9.5 10*3/uL (ref 3.4–10.8)

## 2024-01-26 LAB — CYTOLOGY - PAP
Adequacy: ABSENT
Comment: NEGATIVE
Diagnosis: NEGATIVE
High risk HPV: NEGATIVE

## 2024-01-30 ENCOUNTER — Other Ambulatory Visit (HOSPITAL_COMMUNITY): Payer: Self-pay | Admitting: Obstetrics & Gynecology

## 2024-01-30 DIAGNOSIS — Z1231 Encounter for screening mammogram for malignant neoplasm of breast: Secondary | ICD-10-CM

## 2024-02-13 ENCOUNTER — Encounter: Payer: Self-pay | Admitting: Neurology

## 2024-02-13 ENCOUNTER — Telehealth: Payer: Self-pay | Admitting: Neurology

## 2024-02-13 ENCOUNTER — Ambulatory Visit: Admitting: Neurology

## 2024-02-13 VITALS — BP 136/98 | HR 106 | Ht 70.0 in | Wt 264.8 lb

## 2024-02-13 DIAGNOSIS — U099 Post covid-19 condition, unspecified: Secondary | ICD-10-CM

## 2024-02-13 DIAGNOSIS — Z6837 Body mass index (BMI) 37.0-37.9, adult: Secondary | ICD-10-CM

## 2024-02-13 DIAGNOSIS — E66812 Obesity, class 2: Secondary | ICD-10-CM

## 2024-02-13 DIAGNOSIS — G8929 Other chronic pain: Secondary | ICD-10-CM

## 2024-02-13 DIAGNOSIS — I2692 Saddle embolus of pulmonary artery without acute cor pulmonale: Secondary | ICD-10-CM

## 2024-02-13 DIAGNOSIS — E662 Morbid (severe) obesity with alveolar hypoventilation: Secondary | ICD-10-CM

## 2024-02-13 DIAGNOSIS — G43711 Chronic migraine without aura, intractable, with status migrainosus: Secondary | ICD-10-CM | POA: Insufficient documentation

## 2024-02-13 DIAGNOSIS — E66813 Obesity, class 3: Secondary | ICD-10-CM | POA: Insufficient documentation

## 2024-02-13 DIAGNOSIS — M791 Myalgia, unspecified site: Secondary | ICD-10-CM | POA: Diagnosis not present

## 2024-02-13 DIAGNOSIS — G44229 Chronic tension-type headache, not intractable: Secondary | ICD-10-CM | POA: Diagnosis not present

## 2024-02-13 MED ORDER — RIZATRIPTAN BENZOATE 10 MG PO TBDP: 10.0000 mg | ORAL_TABLET | ORAL | 5 refills | Status: DC | PRN

## 2024-02-13 MED ORDER — TOPIRAMATE 25 MG PO TABS: 25.0000 mg | ORAL_TABLET | Freq: Two times a day (BID) | ORAL | 3 refills | Status: DC

## 2024-02-13 NOTE — Patient Instructions (Addendum)
 Rizatriptan Tablets What is this medication? RIZATRIPTAN (rye za TRIP tan) treats migraines. It works by blocking pain signals and narrowing blood vessels in the brain. It belongs to a group of medications called triptans. It is not used to prevent migraines. This medicine may be used for other purposes; ask your health care provider or pharmacist if you have questions. COMMON BRAND NAME(S): Maxalt What should I tell my care team before I take this medication? They need to know if you have any of these conditions: Circulation problems in fingers and toes Diabetes Heart disease High blood pressure High cholesterol History of irregular heartbeat History of stroke Stomach or intestine problems Tobacco use An unusual or allergic reaction to rizatriptan, other medications, foods, dyes, or preservatives Pregnant or trying to get pregnant Breast-feeding How should I use this medication? Take this medication by mouth with water. Take it as directed on the prescription label. Do not use it more often than directed. Talk to your care team about the use of this medication in children. While it may be prescribed for children as young as 6 years for selected conditions, precautions do apply. Overdosage: If you think you have taken too much of this medicine contact a poison control center or emergency room at once. NOTE: This medicine is only for you. Do not share this medicine with others. What if I miss a dose? This does not apply. This medication is not for regular use. What may interact with this medication? Do not take this medication with any of the following: Ergot alkaloids, such as dihydroergotamine, ergotamine MAOIs, such as Marplan, Nardil, Parnate Other medications for migraine headache, such as almotriptan, eletriptan, frovatriptan, naratriptan, sumatriptan, zolmitriptan This medication may also interact with the following: Certain medications for depression, anxiety, or other mental  health conditions Propranolol This list may not describe all possible interactions. Give your health care provider a list of all the medicines, herbs, non-prescription drugs, or dietary supplements you use. Also tell them if you smoke, drink alcohol, or use illegal drugs. Some items may interact with your medicine. What should I watch for while using this medication? Visit your care team for regular checks on your progress. Tell your care team if your symptoms do not start to get better or if they get worse. This medication may affect your coordination, reaction time, or judgment. Do not drive or operate machinery until you know how this medication affects you. Sit up or stand slowly to reduce the risk of dizzy or fainting spells. If you take migraine medications for 10 or more days a month, your migraines may get worse. Keep a diary of headache days and medication use. Contact your care team if your migraine attacks occur more frequently. What side effects may I notice from receiving this medication? Side effects that you should report to your care team as soon as possible: Allergic reactions--skin rash, itching, hives, swelling of the face, lips, tongue, or throat Burning, pain, tingling, or color changes in the hands, arms, legs, or feet Heart attack--pain or tightness in the chest, shoulders, arms, or jaw, nausea, shortness of breath, cold or clammy skin, feeling faint or lightheaded Heart rhythm changes--fast or irregular heartbeat, dizziness, feeling faint or lightheaded, chest pain, trouble breathing Increase in blood pressure Irritability, confusion, fast or irregular heartbeat, muscle stiffness, twitching muscles, sweating, high fever, seizure, chills, vomiting, diarrhea, which may be signs of serotonin syndrome Raynaud syndrome--cool, numb, or painful fingers or toes that may change color from pale, to blue, to  red Seizures Stroke--sudden numbness or weakness of the face, arm, or leg,  trouble speaking, confusion, trouble walking, loss of balance or coordination, dizziness, severe headache, change in vision Sudden or severe stomach pain, bloody diarrhea, fever, nausea, vomiting Vision loss Side effects that usually do not require medical attention (report to your care team if they continue or are bothersome): Dizziness Unusual weakness or fatigue This list may not describe all possible side effects. Call your doctor for medical advice about side effects. You may report side effects to FDA at 1-800-FDA-1088. Where should I keep my medication? Keep out of the reach of children and pets. Store at room temperature between 15 and 30 degrees C (59 and 86 degrees F). Get rid of any unused medication after the expiration date. To get rid of medications that are no longer needed or have expired: Take the medication to a medication take-back program. Check with your pharmacy or law enforcement to find a location. If you cannot return the medication, check the label or package insert to see if the medication should be thrown out in the garbage or flushed down the toilet. If you are not sure, ask your care team. If it is safe to put it in the trash, empty the medication out of the container. Mix the medication with cat litter, dirt, coffee grounds, or other unwanted substance. Seal the mixture in a bag or container. Put it in the trash. NOTE: This sheet is a summary. It may not cover all possible information. If you have questions about this medicine, talk to your doctor, pharmacist, or health care provider.  2024 Elsevier/Gold Standard (2021-12-31 00:00:00)     Assessment:  Total time for face to face interview and examination, for review of  images and laboratory testing, neurophysiology testing and pre-existing records, including out-of -network , was 55 minutes. Assessment is as follows here:  1)   Multifocal and yet diffuse pain, management addressed in a pain clinic, starting  with back and neck pain in  2007/ and again 2021. There had been no dx .until Ankylosing spondylitis was dx  2024 .  2)  there is neuropathy,  there is cognitive changes, migraine and headaches.  3) sleep is a minor concern here.  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   Plan:  Treatment plan and additional workup planned after today includes:   1)  I will order a HST as she feels unable to sleep in a strange environment.  She needs a special mattress topped and wedge.   2) I will also not take over her pain medication but will start an additional work up she seems thus far not having a true neuropathy work -up. I will order a GNA neuropathy panel and a ATTR panel for amyloidosis.    3) starting Topiramate ( no kidney stone hx) at 25 mg bid po for prevention of migraine and never received more than Imitrex.  She has overused OTC pain medication.    If HST is negative for apnea and hypoxia and Neuropathy panel is  inconclusive , will return to PCP.

## 2024-02-13 NOTE — Progress Notes (Signed)
 [facility]  SLEEP MEDICINE CLINIC   Provider:  Dr Jostin Rue Referring Provider: Duayne Gey, [facility].  [place].  Primary Care Physician:  System, Provider Not In  Chief Complaint  Patient presents with   New Patient (Initial Visit)    RM 1, Pt alone. Paper referral from PCP for migraines.  Not first referral for the patient. In Sept 2024, pain worsened all over, especially nerve pain. Began having difficulty with Right leg - has ongoing low back pain issues. States starts in hip and goes to knee. Has nerve pain in feet but worse in Right foot. Also has neck pain and has been to PT. Had PE last April 2024 - lost job d/t being out with that.  Moved to this area in July of 2024. Pt says all these issues began after having COVID 09/2018, and long COVID began after that in [place]    HPI:  Courtney Patton is a 48 y.o. female  with fibromyalgia and chronic pain, ankylosing spondylitis, and was well controlled until she contracted COVID in 1-24- 2020 and  developed since multiple disabilities, long Covid,  chest pain, SOB,  culminated in a Pulmonary Embolism  in 12-2022, , etc. She has multiple joint pain and is in pain management, cognitive decline has not been formally addressed.   Since then she has GERD, gasping and sleeps propped up now- uses a wedge .  She uses a wedge. She reported Orthostatic dizziness.   This patient is seen here upon referral from PA  Norris for a Consultation/ Evaluation of  HEADACHES, Migraines, she reports 15/ months and other headaches , almost daily Nausea, photophobia. Also neck tension pain. Intractable.  She reported having had only one headache free day in January 2025, the pain can also be a sharp and stabbing pain into the right temple and into the eye-  this in daytime.   Sometimes she can sleep migraines off.   She never has been tested for sleep apnea.   Family history : MGM , mother had migraines.  She has a brother and sister  with tension headaches.  Mother has rheumatoid arthitis,  mother has anxiety/ depression as did MGM, psoriatic arthritis in a brother.  MGF Had RA.  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  No ETOH, No Nicotine, but Caffeine :  1 coke in AM.    Review of Systems: Out of a complete 14 system review, the patient complains of only the following symptoms, and all other reviewed systems are negative. Emory lepses, foggy mind, chronic in  pain, depression,  Epworth Sleepiness score: I can't stay awake-  How likely are you to doze in the following situations: 0 = not likely, 1 = slight chance, 2 = moderate chance, 3 = high chance  Sitting and Reading? 3 Watching Television?3 Sitting inactive in a public place (theater or meeting)? 1 Lying down in the afternoon when circumstances permit?2-3 Sitting and talking to someone? 0 Sitting quietly after lunch without alcohol?2 In a car, while stopped for a few minutes in traffic?1 As a passenger in a car for an hour without a break?2  Total = 15/ 24   FSS : disabling - ever since Covid.  Struggles to take a shower, struggles with brushing her teeth, everything is harder.  Finding words, spelling.   Social History   Socioeconomic History   Marital status: Single    Spouse name:  Not on file   Number of children: 0   Years of education: Not on file   Highest education level: Not on file  Occupational History   Not on file  Tobacco Use   Smoking status: Never   Smokeless tobacco: Never  Vaping Use   Vaping status: Never Used  Substance and Sexual Activity   Alcohol use: Never   Drug use: Never   Sexual activity: Not Currently    Birth control/protection: None  Other Topics Concern   Not on file  Social History Narrative   Not on file   Social Drivers of Health   Financial Resource Strain: High Risk (01/25/2024)   Overall Financial Resource Strain (CARDIA)    Difficulty of Paying Living  Expenses: Hard  Food Insecurity: Food Insecurity Present (01/25/2024)   Hunger Vital Sign    Worried About Running Out of Food in the Last Year: Sometimes true    Ran Out of Food in the Last Year: Never true  Transportation Needs: No Transportation Needs (01/25/2024)   PRAPARE - Administrator, Civil Service (Medical): No    Lack of Transportation (Non-Medical): No  Physical Activity: Inactive (01/25/2024)   Exercise Vital Sign    Days of Exercise per Week: 0 days    Minutes of Exercise per Session: 0 min  Stress: Stress Concern Present (01/25/2024)   [employer] of Occupational Health - Occupational Stress Questionnaire    Feeling of Stress : To some extent  Social Connections: Socially Isolated (01/25/2024)   Social Connection and Isolation Panel [NHANES]    Frequency of Communication with Friends and Family: Once a week    Frequency of Social Gatherings with Friends and Family: Once a week    Attends Religious Services: Never    Database administrator or Organizations: No    Attends Banker Meetings: Never    Marital Status: Never married  Intimate Partner Violence: Not At Risk (01/25/2024)   Humiliation, Afraid, Rape, and Kick questionnaire    Fear of Current or Ex-Partner: No    Emotionally Abused: No    Physically Abused: No    Sexually Abused: No    Family History  Problem Relation Age of Onset   Cancer Mother    COPD Mother    Cancer Maternal Aunt    Diabetes Maternal Uncle    Cancer Other     Past Medical History:  Diagnosis Date   Anemia    Arthritis    Asthma    Chronic fatigue    Fibromyalgia    GERD (gastroesophageal reflux disease)    Hx of pulmonary embolus    Thyroid disease    Vaginal pain     Past Surgical History:  Procedure Laterality Date   APPENDECTOMY  2020   COLONOSCOPY     CYSTOSCOPY     HYSTEROSCOPY WITH D & C     LAPAROSCOPY     Orthoscopy      Current Outpatient Medications  Medication Sig Dispense  Refill   apixaban (ELIQUIS) 5 MG TABS tablet      cyclobenzaprine (FLEXERIL) 5 MG tablet Take 5 mg by mouth 2 (two) times daily. Take 1 tab (5 mg) in the morning and 2 tabs (10 mg) at bedtime     diazepam (VALIUM) 5 MG tablet Take 5 mg by mouth daily as needed.     DULoxetine (CYMBALTA) 60 MG capsule Take 60 mg by mouth daily.     esomeprazole (  NEXIUM) 20 MG capsule      fluticasone (FLONASE) 50 MCG/ACT nasal spray Place into the nose.     levothyroxine (SYNTHROID) 175 MCG tablet Take 175 mcg by mouth daily before breakfast.     Loratadine 10 MG CAPS Take 1 capsule by mouth every morning.     meloxicam (MOBIC) 15 MG tablet Take 15 mg by mouth daily.     traMADol (ULTRAM) 50 MG tablet Take 100 mg by mouth at bedtime.     traMADol (ULTRAM-ER) 200 MG 24 hr tablet Take 200 mg by mouth daily. Take 1 tab each morning     No current facility-administered medications for this visit.    Allergies as of 02/13/2024 - Review Complete 02/13/2024  Allergen Reaction Noted   Codeine Shortness Of Breath 10/15/2010   Etodolac  12/27/2013   Nsaids  10/15/2010    Vitals: BP (!) 136/98 (BP Location: Left Arm, Patient Position: Sitting, Cuff Size: Large)   Pulse (!) 106   Ht 5\' 10"  (1.778 m)   Wt 264 lb 12.8 oz (120.1 kg)   LMP 01/03/2024 (Exact Date)   BMI 37.99 kg/m  Last Weight:  Wt Readings from Last 1 Encounters:  02/13/24 264 lb 12.8 oz (120.1 kg)   Last Height:   Ht Readings from Last 1 Encounters:  02/13/24 5\' 10"  (1.778 m)   Last BMI: Physical exam:  General: The patient is awake, alert and appears not in acute distress.  The patient is well groomed. Head: Normocephalic, atraumatic.  Neck is supple.  Slight hump-  No Goiter   Neck circumference: 17.5 "  Cardiovascular:  Regular rate and palpable peripheral pulse:  Respiratory: clear to auscultation.  Mallampati 3, Skin:  Without evidence of edema, or rash, prednisone  weight gain.  Trunk: BMI is 38  and patient  has normal  posture.   Neurologic exam : The patient is awake and alert, oriented to place and time.  Memory subjective impaired;  There is a normal attention span & concentration ability.  Speech is fluent without  dysarthria, dysphonia or aphasia.  Mood and affect are pleading, anxious.   Cranial nerves: Pupils are equal and briskly reactive to light. Vision  acuity  is decreasing for reading.  Funduscopic exam without  evidence of pallor or edema.  Extraocular movements  in vertical and horizontal planes intact and without nystagmus. Visual fields by finger perimetry are intact. Hearing to finger rub intact.  Facial sensation intact to fine touch.  Facial motor strength is symmetric and tongue and uvula move midline Unable to open the mouth wide, TMJ>   macroglossia.   Motor exam:   Normal tone and normal muscle bulk and symmetric normal strength in all extremities. Grip Strength equal and she trembles when she squeezes.  Proximal strength of shoulder muscles and hip flexors was intact .  Sensory:  Fine touch and vibration were tested .  Burning feet  Proprioception was tested in the upper extremities only and was  normal.  Coordination: Rapid alternating movements in the fingers/hands were normal.  Finger-to-nose maneuver was tested and showed no evidence of ataxia, dysmetria or tremor.  Gait and station: Patient walked without assistive device ,  Core Strength within normal limits.  Stance is stable and of normal base.  Tandem gait is deferred , turns with 4 Steps - unfragmented.  Romberg testing is normal.  Deep tendon reflexes: in the  upper and lower extremities are symmetric and   without Clonus. Babinski maneuver  response is left side ongoing.   Assessment: Total time for face to face interview and examination, for review of  images and laboratory testing, neurophysiology testing and pre-existing records, including out-of -network , was 55 minutes. Assessment is as follows  here:  1)   Multifocal and yet diffuse pain, management addressed in a pain clinic, starting with back and neck pain in  2007/ and again 2021. There had been no dx .until Ankylosing spondylitis was dx  2024 .  2)  there is neuropathy,  there is cognitive changes, migraine and headaches.  3) sleep is a minor concern here.  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   Plan:  Treatment plan and additional workup planned after today includes:   1)  I will order a HST as she feels unable to sleep in a strange environment.  She needs a special mattress topped and wedge.   2) I will also not take over her pain medication but will start an additional work up she seems thus far not having a true neuropathy work -up. I will order a GNA neuropathy panel and a ATTR panel for amyloidosis.    3) starting Topiramate ( no kidney stone hx) at 25 mg bid po for prevention of migraine and never received more than Imitrex.  Offered maxalt  for acute migraine.  She has overused OTC pain medication.   4) her neck was evaluated by MRI 2023.   RV if tests are giving any physiological abnormalities. RV in 6 months.  If HST is negative for apnea and hypoxia and Neuropathy panel is  inconclusive , will return to PCP.  She is in the process of filing for disability .   Neomia Banner, MD  [facility] and [facility] Board certified by The [employer] of Sleep Medicine and Diplomate of the [employer] of Sleep Medicine. Board certified In Neurology through the ABPN, Fellow of the [employer] of Neurology.

## 2024-02-13 NOTE — Telephone Encounter (Signed)
 Dr Dohmeier is recommending the patient complete ATTR gene. I provided the patient with the buccal kit and have had MD sign the form. Educated the pt on how to complete the testing and place in mail to [employer]. Informed the pt to reach out to our office via mychart once she sends it so we can be on lookout for test results. Pt verbalized understanding. Pt had no questions at this time but was encouraged to call back if questions arise.

## 2024-02-14 ENCOUNTER — Encounter: Payer: Self-pay | Admitting: Neurology

## 2024-02-14 NOTE — Telephone Encounter (Signed)
 Dr Albertina Hugger, I spoke with Casie before leaving the office & she said to [facility] you here about a few things that I forgot.  My hands have had a mild tremble 20 years or so, but now shake much worse at times-- maybe for a month or two then mild again for a few weeks. When flared, I drop things frequently or they go "flying" a good distance, my reflexes are hypersensitive, I can't take clear photographs-- the one love/hobby that I'd been able to hold onto even in the worst of the last 5 years, plus it's noticeable to others now.  There has also been an increase in sudden involuntary jerking movements. These are daily, multiple times per day. It'll be a single jerk lasting just a second or two; most of the time it involves my left foot or leg, but occasionally involves my whole body, left shoulder, or my face.  I was also having more muscle twitches, but that has stopped for now; it comes and goes.  Finally, since April I've had "bee sting" type feelings frequently each day. I assume it's nerves- they used to be milder and just quick little electrical zaps. These stings are more frequent and more intense, last anywhere from a second to a couple of minutes. Over the last few days, the after-soreness lingers, sometimes all day. Locations-- usually top of my feet, lower left abdomen; occasionally left breast, bottom of feet.   Thank you so much for your patience and thoroughness today and for truly hearing me. I feel I'm in great hands now. Best wishes.

## 2024-02-15 ENCOUNTER — Ambulatory Visit: Payer: Self-pay | Admitting: Neurology

## 2024-02-17 ENCOUNTER — Encounter: Payer: Self-pay | Admitting: Obstetrics & Gynecology

## 2024-02-17 ENCOUNTER — Telehealth: Payer: Self-pay | Admitting: Neurology

## 2024-02-17 NOTE — Telephone Encounter (Signed)
 HST MCD healthy blue pending

## 2024-02-21 LAB — ENA+DNA/DS+ANTICH+CENTRO+FA...
Anti JO-1: <0.2 AI (ref 0.0–0.9)
Antiribosomal P Antibodies: <0.2 AI (ref 0.0–0.9)
Centromere Ab Screen: <0.2 AI (ref 0.0–0.9)
Chromatin Ab SerPl-aCnc: <0.2 AI (ref 0.0–0.9)
ENA RNP Ab: <0.2 AI (ref 0.0–0.9)
ENA SM Ab Ser-aCnc: <0.2 AI (ref 0.0–0.9)
Scleroderma (Scl-70) (ENA) Antibody, IgG: <0.2 AI (ref 0.0–0.9)
Smith/RNP Antibodies: <0.2 AI (ref 0.0–0.9)
Speckled Pattern: 1:160 {titer} — ABNORMAL HIGH
dsDNA Ab: <1 [IU]/mL (ref 0–9)

## 2024-02-21 LAB — MULTIPLE MYELOMA PANEL, SERUM
Albumin SerPl Elph-Mcnc: 3.9 g/dL (ref 2.9–4.4)
Albumin/Glob SerPl: 1.2 (ref 0.7–1.7)
Alpha 1: 0.3 g/dL (ref 0.0–0.4)
Alpha2 Glob SerPl Elph-Mcnc: 0.8 g/dL (ref 0.4–1.0)
B-Globulin SerPl Elph-Mcnc: 1.1 g/dL (ref 0.7–1.3)
Gamma Glob SerPl Elph-Mcnc: 1.3 g/dL (ref 0.4–1.8)
Globulin, Total: 3.5 g/dL (ref 2.2–3.9)
IgA/Immunoglobulin A, Serum: 152 mg/dL (ref 87–352)
IgG (Immunoglobin G), Serum: 1269 mg/dL (ref 586–1602)
IgM (Immunoglobulin M), Srm: 181 mg/dL (ref 26–217)

## 2024-02-21 LAB — VITAMIN B12: Vitamin B-12: 929 pg/mL (ref 232–1245)

## 2024-02-21 LAB — COMPREHENSIVE METABOLIC PANEL WITH GFR
ALT: 22 IU/L (ref 0–32)
AST: 23 IU/L (ref 0–40)
Albumin: 4.7 g/dL (ref 3.9–4.9)
Alkaline Phosphatase: 81 IU/L (ref 44–121)
BUN/Creatinine Ratio: 13 (ref 9–23)
BUN: 11 mg/dL (ref 6–24)
Bilirubin Total: 0.3 mg/dL (ref 0.0–1.2)
CO2: 22 mmol/L (ref 20–29)
Calcium: 9.6 mg/dL (ref 8.7–10.2)
Chloride: 99 mmol/L (ref 96–106)
Creatinine, Ser: 0.86 mg/dL (ref 0.57–1.00)
Globulin, Total: 2.7 g/dL (ref 1.5–4.5)
Glucose: 94 mg/dL (ref 70–99)
Potassium: 4.5 mmol/L (ref 3.5–5.2)
Sodium: 135 mmol/L (ref 134–144)
Total Protein: 7.4 g/dL (ref 6.0–8.5)
eGFR: 83 mL/min/{1.73_m2} (ref >59)

## 2024-02-21 LAB — CBC WITH DIFFERENTIAL/PLATELET
Basophils Absolute: 0.1 10*3/uL (ref 0.0–0.2)
Basos: 1 %
EOS (ABSOLUTE): 0.2 10*3/uL (ref 0.0–0.4)
Eos: 2 %
Hematocrit: 38.3 % (ref 34.0–46.6)
Hemoglobin: 12.3 g/dL (ref 11.1–15.9)
Immature Grans (Abs): 0 10*3/uL (ref 0.0–0.1)
Immature Granulocytes: 0 %
Lymphocytes Absolute: 2.3 10*3/uL (ref 0.7–3.1)
Lymphs: 25 %
MCH: 28 pg (ref 26.6–33.0)
MCHC: 32.1 g/dL (ref 31.5–35.7)
MCV: 87 fL (ref 79–97)
Monocytes Absolute: 0.5 10*3/uL (ref 0.1–0.9)
Monocytes: 6 %
Neutrophils Absolute: 6.1 10*3/uL (ref 1.4–7.0)
Neutrophils: 66 %
Platelets: 399 10*3/uL (ref 150–450)
RBC: 4.39 x10E6/uL (ref 3.77–5.28)
RDW: 13.2 % (ref 11.7–15.4)
WBC: 9.2 10*3/uL (ref 3.4–10.8)

## 2024-02-21 LAB — HEMOGLOBIN A1C
Est. average glucose Bld gHb Est-mCnc: 114 mg/dL
Hgb A1c MFr Bld: 5.6 % (ref 4.8–5.6)

## 2024-02-21 LAB — SJOGREN'S SYNDROME ANTIBODS(SSA + SSB)
ENA SSA (RO) Ab: <0.2 AI (ref 0.0–0.9)
ENA SSB (LA) Ab: <0.2 AI (ref 0.0–0.9)

## 2024-02-21 LAB — SEDIMENTATION RATE: Sed Rate: 37 mm/h — ABNORMAL HIGH (ref 0–32)

## 2024-02-21 LAB — ANA W/REFLEX: ANA Titer 1: POSITIVE — AB

## 2024-02-21 LAB — METHYLMALONIC ACID, SERUM: Methylmalonic Acid: 148 nmol/L (ref 0–378)

## 2024-02-21 LAB — C-REACTIVE PROTEIN: CRP: 2 mg/L (ref 0–10)

## 2024-02-21 LAB — HOMOCYSTEINE: Homocysteine: 6.6 umol/L (ref 0.0–14.5)

## 2024-02-21 LAB — TSH: TSH: 0.528 u[IU]/mL (ref 0.450–4.500)

## 2024-02-22 ENCOUNTER — Other Ambulatory Visit

## 2024-02-22 ENCOUNTER — Ambulatory Visit: Admitting: Obstetrics & Gynecology

## 2024-02-22 NOTE — Telephone Encounter (Signed)
 Patient returned phone call,would like a call back.

## 2024-02-22 NOTE — Telephone Encounter (Signed)
 Called the patient and reviewed the lab results with her. Pt has a rheumatologist already. Per pt request I have faxed these over to her rheumatologist. Pt verbalized understanding.Pt had no questions at this time but was encouraged to call back if questions arise.

## 2024-02-27 ENCOUNTER — Encounter (HOSPITAL_COMMUNITY): Payer: Self-pay

## 2024-02-27 ENCOUNTER — Ambulatory Visit (HOSPITAL_COMMUNITY)
Admission: RE | Admit: 2024-02-27 | Discharge: 2024-02-27 | Disposition: A | Discharge status: Home / Self Care | Source: Ambulatory Visit | Attending: Obstetrics & Gynecology | Admitting: Obstetrics & Gynecology

## 2024-02-27 DIAGNOSIS — Z1231 Encounter for screening mammogram for malignant neoplasm of breast: Secondary | ICD-10-CM | POA: Diagnosis present

## 2024-02-27 NOTE — Telephone Encounter (Signed)
 Still pending

## 2024-02-28 ENCOUNTER — Ambulatory Visit

## 2024-02-28 ENCOUNTER — Encounter: Payer: Self-pay | Admitting: Obstetrics & Gynecology

## 2024-02-28 ENCOUNTER — Ambulatory Visit: Admitting: Obstetrics & Gynecology

## 2024-02-28 VITALS — BP 122/80 | HR 98 | Ht 70.0 in | Wt 264.0 lb

## 2024-02-28 DIAGNOSIS — R102 Pelvic and perineal pain: Secondary | ICD-10-CM | POA: Diagnosis not present

## 2024-02-28 DIAGNOSIS — N920 Excessive and frequent menstruation with regular cycle: Secondary | ICD-10-CM

## 2024-02-28 DIAGNOSIS — N951 Menopausal and female climacteric states: Secondary | ICD-10-CM

## 2024-02-28 DIAGNOSIS — Z7901 Long term (current) use of anticoagulants: Secondary | ICD-10-CM

## 2024-02-28 NOTE — Progress Notes (Signed)
   GYN VISIT Patient name: Courtney Patton MRN [id number]  Date of birth: 08-03-1976 Chief Complaint:   No chief complaint on file.  History of Present Illness:   Courtney Patton is a 48 y.o. G0P0000 perimenopuase female being seen today for follow up regarding:  HMB/pelvic pain: At her last visit she noted- Menses are every month- typically 7 days on occasion may be longer. Menses are very heavy- may soak through in an hour or two. On occasion may have an accident due to the bleeding. Typically does not have intermenstrual bleeding.  Bleeding has not led to anemia, last hemoglobin 12.1.  Upon further questioning it sounds as though she was most concerned about pelvic pain.  Patient notes history of fibromyalgia and potentially in the process of possible lupus diagnosis  Due to h/o PE currently on Eliquis  US  today: homogeneous anteverted uterus,WNL,EEC 12 mm,normal ovaries,small amount of simple cul de sac fluid,pelvic pain during ultrasound   Her other concern has been vaginal dryness and discomfort.  Denies irregular discharge or itching.  Pap showed no evidence of infection  Patient's last menstrual period was 01/22/2024.    Review of Systems:   Pertinent items are noted in HPI  Pertinent History Reviewed:   Past Surgical History:  Procedure Laterality Date   APPENDECTOMY  2020   COLONOSCOPY     CYSTOSCOPY     HYSTEROSCOPY WITH D & C     and LEEP   LAPAROSCOPY     Orthoscopy      Past Medical History:  Diagnosis Date   Anemia    Arthritis    Asthma    Chronic fatigue    Fibromyalgia    GERD (gastroesophageal reflux disease)    Hx of pulmonary embolus    Thyroid disease    Vaginal pain    Reviewed problem list, medications and allergies. Physical Assessment:   Vitals:   02/28/24 1528  BP: 122/80  Pulse: 98  Weight: 264 lb (119.7 kg)  Height: 5' 10 (1.778 m)  Body mass index is 37.88 kg/m.       Physical Examination:   General appearance: alert, well  appearing, and in no distress  Psych: mood appropriate, normal affect  Skin: warm & dry   Cardiovascular: normal heart rate noted  Respiratory: normal respiratory effort, no distress  Extremities: no edema   Chaperone: N/A    Assessment & Plan:  1) Perimenopausal status - Reviewed today's ultrasound, no acute abnormalities noted - Discussed that should the heavy menses be an issue for her we can discuss medical management.  Patient states she prefer to avoid medication if possible and feels as though her periods are tolerable to her.  She was most concerned about the pelvic pain and potential abnormalities which were not found on pelvic ultrasound  2) Vaginal dryness - Briefly discussed conservative options ranging from coconut oil to personal moisturizers - Should this become more of an issue may consider Estrace cream in the future - Patient to follow-up as needed   No orders of the defined types were placed in this encounter.   No follow-ups on file.   Price Lachapelle, DO Attending Obstetrician & Gynecologist, [facility] for [employer], [facility]

## 2024-02-28 NOTE — Progress Notes (Signed)
 PELVIC US  TA/TV: homogeneous anteverted uterus,WNL,EEC 12 mm,normal ovaries,small amount of simple cul de sac fluid,pelvic pain during ultrasound  Chaperone Tammy

## 2024-02-29 ENCOUNTER — Ambulatory Visit: Payer: Self-pay | Admitting: Obstetrics & Gynecology

## 2024-02-29 ENCOUNTER — Ambulatory Visit: Payer: Medicaid Other | Admitting: Neurology

## 2024-03-05 NOTE — Telephone Encounter (Signed)
 HST MCD Healthy blue shara: [id number] (exp. 02/27/24 to 04/26/24)

## 2024-03-14 ENCOUNTER — Other Ambulatory Visit (HOSPITAL_COMMUNITY): Payer: Self-pay

## 2024-03-14 ENCOUNTER — Telehealth: Payer: Self-pay

## 2024-03-14 DIAGNOSIS — E278 Other specified disorders of adrenal gland: Secondary | ICD-10-CM

## 2024-03-14 NOTE — Telephone Encounter (Signed)
 Cld Pt to make her aware we had received the results from the genetics testing and results were negative. Pt voiced understanding and had no questions. She stated the genetic counselor reached out to her regarding possible further testing so she is considering that. Pt also stated that since being on the prednisone ordered by her rheumatologist, she has experienced numbness and burning sensation in her feet. Inquired if Pt had reached out to her rheumatologist, and she stated she had not. Encouraged Pt to make rheumatologist aware and if they think Pt needs f/u with neurology to make us  aware. Pt voiced understanding and thanks for the call.

## 2024-03-20 ENCOUNTER — Ambulatory Visit (INDEPENDENT_AMBULATORY_CARE_PROVIDER_SITE_OTHER): Admitting: Neurology

## 2024-03-20 DIAGNOSIS — G4733 Obstructive sleep apnea (adult) (pediatric): Secondary | ICD-10-CM | POA: Diagnosis not present

## 2024-03-20 DIAGNOSIS — I2692 Saddle embolus of pulmonary artery without acute cor pulmonale: Secondary | ICD-10-CM

## 2024-03-20 DIAGNOSIS — E662 Morbid (severe) obesity with alveolar hypoventilation: Secondary | ICD-10-CM

## 2024-03-20 DIAGNOSIS — G43711 Chronic migraine without aura, intractable, with status migrainosus: Secondary | ICD-10-CM

## 2024-03-20 DIAGNOSIS — G8929 Other chronic pain: Secondary | ICD-10-CM

## 2024-03-20 DIAGNOSIS — G44229 Chronic tension-type headache, not intractable: Secondary | ICD-10-CM

## 2024-03-21 NOTE — Progress Notes (Signed)
 [facility]   HOME SLEEP TEST REPORT ( by Watch PAT)   STUDY DATE:  03/21/2024- 03/22/2024 Courtney Patton   ORDERING CLINICIAN: Dedra Gores, MD  REFERRING CLINICIAN: Lynwood Her, PA  [place], [place]  CC: Delon Ozan,DO   CLINICAL INFORMATION/HISTORY:  Pulmonary Embolism patient .  Courtney Patton is a 48 y.o. female  with fibromyalgia and chronic pain, ankylosing spondylitis, and was well controlled until she contracted COVID in 1-24- 2020 and  developed since multiple disabilities, long Covid,  chest pain, SOB,  culminated in a Pulmonary Embolism  in 12-2022, , etc. She has multiple joint pain and is in pain management, cognitive decline has not been formally addressed.    Since then she has GERD, gasping and sleeps propped up now- uses a wedge .  She uses a wedge. She reported Orthostatic dizziness.    This patient is seen here upon referral from PA  Norris for a Consultation/ Evaluation of  HEADACHES, Migraines, she reports 15/ months and other headaches , almost daily Nausea, photophobia. Also neck tension pain. Intractable.  She reported having had only one headache free day in January 2025, the pain can also be a sharp and stabbing pain into the right temple and into the eye-  this in daytime.    Sometimes she can sleep migraines off.    She never has been tested for sleep apnea.      Epworth sleepiness score: 15 /24.   BMI: 38  kg/m   Neck Circumference: 17.5   FINDINGS:   Sleep Summary:   Total Recording Time (hours, min):     9 h 12 minutes   Total Sleep Time (hours, min):     6 h 41 m            Percent REM (%):    25%  Highly fragmented sleep architecture.   Sleep latency of 23 minutes  REM sleep latency of 244 minutes                                     Respiratory Indices:   Calculated pAHI (per Medicaid CMS guideline): 3.1/h                         REM pAHI:      11.4/h                                           NREM  pAHI:    0.4/h                          Positional AHI:   supine AHI was 2.8/h and non supine ( right lateral)  4.3/h    Snoring:   50% of the night, at 43 dB                                             Oxygen Saturation Statistics:   Oxygen Saturation (%) Mean:   96%  O2 Saturation Range (%):   87 through 100% ( high)                                     O2 Saturation (minutes) <89%:  0 minutes         Pulse Rate Statistics:   Pulse Mean (bpm):   74 bpm              Pulse Range:    61 through 138 ( one single peak at tachycardia, not sustained).              IMPRESSION:  This HST did not confirm sleep apnea or sleep hypoxia to be present.  The chest wall electrode did record vibration, interpreted as snoring.    RECOMMENDATION: no sleep medicine intervention needed.   Any patient should be cautioned not to drive, work at heights, or operate dangerous or heavy equipment when tired or sleepy.   Review of good sleep hygiene measures is accessible to any sleep clinic patient and can be reiterated through online material- I we recommend the Guide to better Sleep   by the NIH.   Weight loss and Core Strength improvement is recommended for individuals with low muscle tone and/ or a BMI over 32.  The referring physician will be notified of the test results.       INTERPRETING PHYSICIAN:   Dedra Gores, MD  [facility] and [facility] Sleep Board certified by The [employer] of Sleep Medicine and Diplomate of the [employer] of Sleep Medicine. Board certified In Neurology through the ABPN, Fellow of the [employer] of Neu[employer].

## 2024-03-26 ENCOUNTER — Ambulatory Visit (HOSPITAL_COMMUNITY)
Admission: RE | Admit: 2024-03-26 | Discharge: 2024-03-26 | Disposition: A | Discharge status: Home / Self Care | Source: Ambulatory Visit

## 2024-03-26 DIAGNOSIS — E278 Other specified disorders of adrenal gland: Secondary | ICD-10-CM | POA: Insufficient documentation

## 2024-03-26 MED ORDER — GADOBUTROL 1 MMOL/ML IV SOLN
10.0000 mL | Freq: Once | INTRAVENOUS | Status: AC | PRN
  Administered 2024-03-26: 10 mL via INTRAVENOUS

## 2024-03-29 ENCOUNTER — Encounter: Payer: Self-pay | Admitting: Neurology

## 2024-03-30 ENCOUNTER — Encounter (HOSPITAL_COMMUNITY): Payer: Self-pay

## 2024-04-03 NOTE — Procedures (Signed)
 [facility]   HOME SLEEP TEST REPORT ( by Watch PAT)   STUDY DATE:  03/21/2024- 03/22/2024 Courtney Patton   ORDERING CLINICIAN: Dedra Gores, MD  REFERRING CLINICIAN: Lynwood Her, PA  [place], [place]  CC: Delon Ozan,DO   CLINICAL INFORMATION/HISTORY:  Pulmonary Embolism patient .  Courtney Patton is a 48 y.o. female  with fibromyalgia and chronic pain, ankylosing spondylitis, and was well controlled until she contracted COVID in 1-24- 2020 and  developed since multiple disabilities, long Covid,  chest pain, SOB,  culminated in a Pulmonary Embolism  in 12-2022, , etc. She has multiple joint pain and is in pain management, cognitive decline has not been formally addressed.    Since then she has GERD, gasping and sleeps propped up now- uses a wedge .  She uses a wedge. She reported Orthostatic dizziness.    This patient is seen here upon referral from PA  Norris for a Consultation/ Evaluation of  HEADACHES, Migraines, she reports 15/ months and other headaches , almost daily Nausea, photophobia. Also neck tension pain. Intractable.  She reported having had only one headache free day in January 2025, the pain can also be a sharp and stabbing pain into the right temple and into the eye-  this in daytime.    Sometimes she can sleep migraines off.    She never has been tested for sleep apnea.      Epworth sleepiness score: 15 /24.   BMI: 38  kg/m   Neck Circumference: 17.5   FINDINGS:   Sleep Summary:   Total Recording Time (hours, min):     9 h 12 minutes   Total Sleep Time (hours, min):     6 h 41 m            Percent REM (%):    25%  Highly fragmented sleep architecture.   Sleep latency of 23 minutes  REM sleep latency of 244 minutes                                     Respiratory Indices:   Calculated pAHI (per Medicaid CMS guideline): 3.1/h                         REM pAHI:      11.4/h                                           NREM pAHI:    0.4/h                           Positional AHI:   supine AHI was 2.8/h and non supine ( right lateral)  4.3/h    Snoring:   50% of the night, at 43 dB                                             Oxygen Saturation Statistics:   Oxygen Saturation (%) Mean:   96%             O2 Saturation Range (%):  87 through 100% ( high)                                     O2 Saturation (minutes) <89%:  0 minutes         Pulse Rate Statistics:   Pulse Mean (bpm):   74 bpm              Pulse Range:    61 through 138 ( one single peak at tachycardia, not sustained).              IMPRESSION:  This HST did not confirm sleep apnea or sleep hypoxia to be present.  The chest wall electrode did record vibration, interpreted as snoring.    RECOMMENDATION: no sleep medicine intervention needed.   Any patient should be cautioned not to drive, work at heights, or operate dangerous or heavy equipment when tired or sleepy.   Review of good sleep hygiene measures is accessible to any sleep clinic patient and can be reiterated through online material- I we recommend the Guide to better Sleep   by the NIH.   Weight loss and Core Strength improvement is recommended for individuals with low muscle tone and/ or a BMI over 32.  The referring physician will be notified of the test results.       INTERPRETING PHYSICIAN:   Dedra Gores, MD  [facility] Sleep Board certified by The [employer] of Sleep Medicine and Diplomate of the [employer] of Sleep Medicine. Board certified In Neurology through the ABPN, Fellow of the [employer] of Neurology.

## 2024-04-04 ENCOUNTER — Other Ambulatory Visit: Payer: Self-pay | Admitting: Neurology

## 2024-04-04 DIAGNOSIS — R208 Other disturbances of skin sensation: Secondary | ICD-10-CM

## 2024-04-04 DIAGNOSIS — F4542 Pain disorder with related psychological factors: Secondary | ICD-10-CM

## 2024-04-04 NOTE — Progress Notes (Signed)
 Ordered NCV and EMG for patient

## 2024-04-05 ENCOUNTER — Telehealth: Payer: Self-pay | Admitting: Neurology

## 2024-04-05 NOTE — Telephone Encounter (Signed)
 Pt called and scheduled her NCV

## 2024-04-19 ENCOUNTER — Other Ambulatory Visit (HOSPITAL_COMMUNITY): Payer: Self-pay

## 2024-04-19 DIAGNOSIS — M7661 Achilles tendinitis, right leg: Secondary | ICD-10-CM

## 2024-04-25 ENCOUNTER — Ambulatory Visit (HOSPITAL_COMMUNITY)
Admission: RE | Admit: 2024-04-25 | Discharge: 2024-04-25 | Disposition: A | Discharge status: Home / Self Care | Source: Ambulatory Visit

## 2024-04-25 DIAGNOSIS — M7661 Achilles tendinitis, right leg: Secondary | ICD-10-CM | POA: Insufficient documentation

## 2024-05-17 ENCOUNTER — Encounter: Payer: Self-pay | Admitting: Neurology

## 2024-05-21 ENCOUNTER — Other Ambulatory Visit: Payer: Self-pay | Admitting: Neurology

## 2024-05-21 DIAGNOSIS — I2692 Saddle embolus of pulmonary artery without acute cor pulmonale: Secondary | ICD-10-CM

## 2024-05-21 DIAGNOSIS — G43711 Chronic migraine without aura, intractable, with status migrainosus: Secondary | ICD-10-CM

## 2024-05-21 DIAGNOSIS — R208 Other disturbances of skin sensation: Secondary | ICD-10-CM

## 2024-05-21 DIAGNOSIS — R531 Weakness: Secondary | ICD-10-CM

## 2024-05-22 ENCOUNTER — Telehealth: Payer: Self-pay | Admitting: Neurology

## 2024-05-22 NOTE — Telephone Encounter (Signed)
 healthy blue shara: [phone] exp. 05/22/24-08/19/24 sent to GI [phone]

## 2024-06-04 ENCOUNTER — Telehealth: Payer: Self-pay | Admitting: Neurology

## 2024-06-04 NOTE — Telephone Encounter (Signed)
 Pt was confirmed for NCS this Wednesday and wants to make sure she can take certain meds. Please call to let her know

## 2024-06-06 ENCOUNTER — Encounter: Payer: Self-pay | Admitting: Neurology

## 2024-06-06 ENCOUNTER — Ambulatory Visit: Admitting: Neurology

## 2024-06-06 DIAGNOSIS — F4542 Pain disorder with related psychological factors: Secondary | ICD-10-CM

## 2024-06-06 DIAGNOSIS — R208 Other disturbances of skin sensation: Secondary | ICD-10-CM

## 2024-06-06 NOTE — Progress Notes (Unsigned)
 Chief Complaint  Patient presents with   emg     Rm 4, emg      ASSESSMENT AND PLAN  Courtney Patton is a 48 y.o. female   Long history of intermittent limb paresthesia, body achy pain,  EMG nerve conduction study June 06, 2024 was essentially normal, no evidence of large fiber peripheral neuropathy or intrinsic muscle disease.  Laboratory evaluation to demonstrate positive ANA with 1:160 titer,  She is to continue follow-up with her primary care if needed rheumatologist  DIAGNOSTIC DATA (LABS, IMAGING, TESTING) - I reviewed patient records, labs, notes, testing and imaging myself where available.   MEDICAL HISTORY:  Courtney Patton is a 48 year old female, seen in request by Dohmeier for evaluation of intermittent limb paresthesia  History is obtained from the patient and review of electronic medical records. I personally reviewed pertinent available imaging films in PACS.   PMHx of  Hypothyrodism Obesity,  Hx of pulmonary embolus,   Since her COVID in 2022, she developed bilateral lower extremity numb tingling burning pain, occasionally hands involvement, get much worse since April 2025, she complains of diffuse body achy pain, intermittent limb paresthesia, put significant limitation in her daily activity  She also had a history of pulmonary emboli receiving Eliquis, taking Cymbalta, tramadol, muscle relaxant, even tramadol, Valium as needed provide limited help to her symptoms  Extensive laboratory evaluation 2025 showed normal or negative ESR, C-reactive protein, protein electrophoresis, A1c, TSH, B12, CMP, CBC, methylmalonic acid level, positive ANA with speckled pattern 1-160   PHYSICAL EXAM:   Vitals:   06/06/24 0814  BP: 136/80  Weight: 256 lb (116.1 kg)  Height: 5' 10 (1.778 m)   Body mass index is 36.73 kg/m.  PHYSICAL EXAMNIATION:  Gen: NAD, conversant, well nourised, well groomed                     Cardiovascular: Regular rate rhythm, no  peripheral edema, warm, nontender. Eyes: Conjunctivae clear without exudates or hemorrhage Neck: Supple, no carotid bruits. Pulmonary: Clear to auscultation bilaterally   NEUROLOGICAL EXAM:  MENTAL STATUS: Speech/cognition: [name], awake, alert, oriented to history taking and casual conversation CRANIAL NERVES: CN II: Visual fields are full to confrontation. Pupils are round equal and briskly reactive to light. CN III, IV, VI: extraocular movement are normal. No ptosis. CN V: Facial sensation is intact to light touch CN VII: Face is symmetric with normal eye closure  CN VIII: Hearing is normal to causal conversation. CN IX, X: Phonation is normal. CN XI: Head turning and shoulder shrug are intact  MOTOR: There is no pronator drift of out-stretched arms. Muscle bulk and tone are normal. Muscle strength is normal.  REFLEXES: Reflexes are 2+ and symmetric at the biceps, triceps, knees, and ankles. Plantar responses are flexor.  SENSORY: Intact to light touch, pinprick and vibratory sensation are intact in fingers and toes.  COORDINATION: There is no trunk or limb dysmetria noted.  GAIT/STANCE: Posture is normal. Gait is steady   REVIEW OF SYSTEMS:  Full 14 system review of systems performed and notable only for as above All other review of systems were negative.   ALLERGIES: Allergies  Allergen Reactions   Codeine Shortness Of Breath    Other reaction(s): Nervous   Etodolac     Other reaction(s): Unknown   Nsaids     HOME MEDICATIONS: Current Outpatient Medications  Medication Sig Dispense Refill   apixaban (ELIQUIS) 5 MG TABS tablet  cyclobenzaprine (FLEXERIL) 5 MG tablet Take 5 mg by mouth 2 (two) times daily. Take 1 tab (5 mg) in the morning and 2 tabs (10 mg) at bedtime     diazepam (VALIUM) 5 MG tablet Take 5 mg by mouth daily as needed.     DULoxetine (CYMBALTA) 60 MG capsule Take 60 mg by mouth daily.     esomeprazole (NEXIUM)  20 MG capsule      fluticasone (FLONASE) 50 MCG/ACT nasal spray Place into the nose.     HORIZANT 300 MG TBCR Take 300 mg by mouth daily.     levothyroxine (SYNTHROID) 175 MCG tablet Take 175 mcg by mouth daily before breakfast.     Loratadine 10 MG CAPS Take 1 capsule by mouth every morning.     meloxicam (MOBIC) 15 MG tablet Take 15 mg by mouth daily.     RINVOQ 15 MG TB24 Take 15 mg by mouth daily.     rizatriptan  (MAXALT -MLT) 10 MG disintegrating tablet Take 1 tablet (10 mg total) by mouth as needed for migraine. May repeat in 2 hours if needed 10 tablet 5   topiramate  (TOPAMAX ) 25 MG tablet Take 1 tablet (25 mg total) by mouth 2 (two) times daily. 120 tablet 3   traMADol (ULTRAM) 50 MG tablet Take 100 mg by mouth at bedtime.     traMADol (ULTRAM-ER) 200 MG 24 hr tablet Take 200 mg by mouth daily. Take 1 tab each morning     predniSONE (DELTASONE) 10 MG tablet Take 10 mg by mouth daily with breakfast.     No current facility-administered medications for this visit.    PAST MEDICAL HISTORY: Past Medical History:  Diagnosis Date   Anemia    Arthritis    Asthma    Chronic fatigue    Fibromyalgia    GERD (gastroesophageal reflux disease)    Hx of pulmonary embolus    Thyroid disease    Vaginal pain     PAST SURGICAL HISTORY: Past Surgical History:  Procedure Laterality Date   APPENDECTOMY  2020   COLONOSCOPY     CYSTOSCOPY     HYSTEROSCOPY WITH D & C     and LEEP   LAPAROSCOPY     Orthoscopy      FAMILY HISTORY: Family History  Problem Relation Age of Onset   Cancer Mother    COPD Mother    Breast cancer Maternal Aunt    Cancer Maternal Aunt    Breast cancer Maternal Aunt    Diabetes Maternal Uncle    Cancer Other     SOCIAL HISTORY: Social History   Socioeconomic History   Marital status: Single    Spouse name: Not on file   Number of children: 0   Years of education: Not on file   Highest education level: Not on file  Occupational History   Not on  file  Tobacco Use   Smoking status: Never   Smokeless tobacco: Never  Vaping Use   Vaping status: Never Used  Substance and Sexual Activity   Alcohol use: Never   Drug use: Never   Sexual activity: Not Currently    Birth control/protection: None  Other Topics Concern   Not on file  Social History Narrative   Right handed   Caffeine-1-2 cups daily   Social Drivers of Health   Financial Resource Strain: High Risk (01/25/2024)   Overall Financial Resource Strain (CARDIA)    Difficulty of Paying Living Expenses: Hard  Food Insecurity:  Food Insecurity Present (01/25/2024)   Hunger Vital Sign    Worried About Running Out of Food in the Last Year: Sometimes true    Ran Out of Food in the Last Year: Never true  Transportation Needs: No Transportation Needs (01/25/2024)   PRAPARE - Administrator, Civil Service (Medical): No    Lack of Transportation (Non-Medical): No  Physical Activity: Inactive (01/25/2024)   Exercise Vital Sign    Days of Exercise per Week: 0 days    Minutes of Exercise per Session: 0 min  Stress: Stress Concern Present (01/25/2024)   [employer] of Occupational Health - Occupational Stress Questionnaire    Feeling of Stress : To some extent  Social Connections: Socially Isolated (01/25/2024)   Social Connection and Isolation Panel    Frequency of Communication with Friends and Family: Once a week    Frequency of Social Gatherings with Friends and Family: Once a week    Attends Religious Services: Never    Database administrator or Organizations: No    Attends Banker Meetings: Never    Marital Status: Never married  Intimate Partner Violence: Not At Risk (01/25/2024)   Humiliation, Afraid, Rape, and Kick questionnaire    Fear of Current or Ex-Partner: No    Emotionally Abused: No    Physically Abused: No    Sexually Abused: No      Modena Callander, M.D. Ph.D.  [facility] [address], [address] [place],  [place] [address] Ph: [phone] Fax: [phone]  CC:  No referring provider defined for this encounter.  System, Provider Not In

## 2024-06-06 NOTE — Procedures (Signed)
 Full Name: Courtney Patton Gender: Female MRN #: [id number] Date of Birth: 1976/05/23    Visit Date: 06/06/2024 08:47 Age: 48 Years Examining Physician: Onita Duos Referring Physician: Dohmeier Height: 5 feet 10 inch History: 48 year old female complains of intermittent upper lower extremity paresthesia since COVID in 2022  Summary of the test: Nerve conduction study: Bilateral sural, superficial peroneal sensory responses were normal.  Bilateral peroneal to EDB tibial motor responses were normal.  Bilateral median sensory responses showed mildly prolonged peak latency with well-preserved snap amplitude.  Bilateral median motor responses were within normal limit, right side distal latency has upper limit of normal range.  Bilateral ulnar sensory motor responses were normal.  Electromyography: Selected needle examination of bilateral lower extremity muscles, lumbar sacral paraspinal muscles, right upper extremity and right cervical paraspinal muscles were normal.    Conclusion: This is a mild abnormal study.  There is electrodiagnostic evidence of mild bilateral distal median neuropathy across the wrist, consistent with mild bilateral carpal tunnel syndromes.  There is no evidence of large fiber peripheral neuropathy.    ------------------------------- Duos Onita, M.D. Ph.D.  [facility] [address] [place], [place] [address] Phone: [phone] Fax:      [phone]   [facility] [address] [place], [place] [address] Tel: [phone] Fax: [phone]  Verbal informed consent was obtained from the patient, patient was informed of potential risk of procedure, including bruising, bleeding, hematoma formation, infection, muscle weakness, muscle pain, numbness, among others.        MNC    Nerve / Sites Muscle Latency Ref. Amplitude Ref. Rel Amp Segments Distance Velocity Ref. Area    ms ms mV mV %  cm m/s m/s mVms  R  Median - APB     Wrist APB 4.4 <=4.4 7.6 >=4.0 100 Wrist - APB 7   28.5     Upper arm APB 8.7  8.3  109 Upper arm - Wrist 24 55 >=49 32.3  L Median - APB     Wrist APB 4.2 <=4.4 8.3 >=4.0 100 Wrist - APB 7   30.9     Upper arm APB 8.4  8.0  95.9 Upper arm - Wrist 23 55 >=49 29.5  R Ulnar - ADM     Wrist ADM 2.7 <=3.3 8.3 >=6.0 100 Wrist - ADM 7   26.9     B.Elbow ADM 4.7  8.1  97.8 B.Elbow - Wrist 13 65 >=49 26.5     A.Elbow ADM 7.1  8.1  99.9 A.Elbow - B.Elbow 15 63 >=49 26.9  L Ulnar - ADM     Wrist ADM 2.9 <=3.3 6.1 >=6.0 100 Wrist - ADM 7   22.7     B.Elbow ADM 4.7  7.0  115 B.Elbow - Wrist 15 84 >=49 23.6     A.Elbow ADM 7.1  6.9  98.6 A.Elbow - B.Elbow 14 58 >=49 24.7  R Peroneal - EDB     Ankle EDB 4.4 <=6.5 4.8 >=2.0 100 Ankle - EDB 9   16.8     Fib head EDB 12.7  4.5  94.9 Fib head - Ankle 35 42 >=44 16.4     Pop fossa EDB 14.8  5.1  114 Pop fossa - Fib head 9 42 >=44 18.7         Pop fossa - Ankle      L Peroneal - EDB     Ankle EDB 4.8 <=6.5 3.6 >=2.0  100 Ankle - EDB 9   16.2     Fib head EDB 12.8  3.4  93.8 Fib head - Ankle 34 42 >=44 11.7     Pop fossa EDB 15.0  3.3  97.5 Pop fossa - Fib head 9 42 >=44 12.3         Pop fossa - Ankle      R Tibial - AH     Ankle AH 4.8 <=5.8 5.6 >=4.0 100 Ankle - AH 9   13.2     Pop fossa AH 16.3  3.7  65.8 Pop fossa - Ankle 51 45 >=41 9.6  L Tibial - AH     Ankle AH 4.5 <=5.8 7.6 >=4.0 100 Ankle - AH 9   19.5     Pop fossa AH 16.0  5.2  67.9 Pop fossa - Ankle 47 41 >=41 16.2                     SNC    Nerve / Sites Rec. Site Peak Lat Ref.  Amp Ref. Segments Distance    ms ms V V  cm  L Sural - Ankle (Calf)     Calf Ankle 4.0 <=4.4 6 >=6 Calf - Ankle 14  R Sural - Ankle (Calf)     Calf Ankle 3.8 <=4.4 6 >=6 Calf - Ankle 14  L Superficial peroneal - Ankle     Lat leg Ankle 4.1 <=4.4 9 >=6 Lat leg - Ankle 14  R Superficial peroneal - Ankle     Lat leg Ankle 3.9 <=4.4 11 >=6 Lat leg - Ankle 14  R Median - Orthodromic (Dig II, Mid  palm)     Dig II Wrist 3.9 <=3.4 13 >=10 Dig II - Wrist 13  L Median - Orthodromic (Dig II, Mid palm)     Dig II Wrist 3.5 <=3.4 15 >=10 Dig II - Wrist 13  R Ulnar - Orthodromic, (Dig V, Mid palm)     Dig V Wrist 2.7 <=3.1 7 >=5 Dig V - Wrist 11  L Ulnar - Orthodromic, (Dig V, Mid palm)     Dig V Wrist 2.5 <=3.1 8 >=5 Dig V - Wrist 50                     F  Wave    Nerve F Lat Ref.   ms ms  R Ulnar - ADM 28.9 <=32.0  L Ulnar - ADM 27.5 <=32.0  R Tibial - AH 60.2 <=56.0  L Tibial - AH 63.8 <=56.0             EMG Summary Table    Spontaneous MUAP Recruitment  Muscle IA Fib PSW Fasc Other Amp Dur. Poly Pattern  R. Tibialis anterior Normal None None None _______ Normal Normal Normal Normal  R. Tibialis posterior Normal None None None _______ Normal Normal Normal Normal  R. Peroneus longus Normal None None None _______ Normal Normal Normal Normal  R. Gastrocnemius (Medial head) Normal None None None _______ Normal Normal Normal Normal  R. Vastus lateralis Normal None None None _______ Normal Normal Normal Normal  L. Tibialis anterior Normal None None None _______ Normal Normal Normal Normal  L. Tibialis posterior Normal None None None _______ Normal Normal Normal Normal  L. Gastrocnemius (Medial head) Normal None None None _______ Normal Normal Normal Normal  L. Vastus lateralis Normal None None None _______ Normal Normal Normal Normal  R. Lumbar paraspinals (low)  Normal None None None _______ Normal Normal Normal Normal  L. Lumbar paraspinals (low) Normal None None None _______ Normal Normal Normal Normal  L. Lumbar paraspinals (mid) Normal None None None _______ Normal Normal Normal Normal  R. First dorsal interosseous Normal None None None _______ Normal Normal Normal Normal  R. Extensor digitorum communis Normal None None None _______ Normal Normal Normal Normal  R. Biceps brachii Normal None None None _______ Normal Normal Normal Normal  R. Deltoid Normal None None None _______  Normal Normal Normal Normal  R. Triceps brachii Normal None None None _______ Normal Normal Normal Normal  R. Cervical paraspinals Normal None None None _______ Normal Normal Normal Normal

## 2024-06-14 ENCOUNTER — Ambulatory Visit: Payer: Self-pay | Admitting: Neurology

## 2024-06-18 ENCOUNTER — Ambulatory Visit (INDEPENDENT_AMBULATORY_CARE_PROVIDER_SITE_OTHER): Admitting: Neurology

## 2024-06-18 ENCOUNTER — Ambulatory Visit
Admission: RE | Admit: 2024-06-18 | Discharge: 2024-06-18 | Disposition: A | Discharge status: Home / Self Care | Source: Ambulatory Visit | Attending: Neurology | Admitting: Neurology

## 2024-06-18 DIAGNOSIS — I2692 Saddle embolus of pulmonary artery without acute cor pulmonale: Secondary | ICD-10-CM

## 2024-06-18 DIAGNOSIS — R531 Weakness: Secondary | ICD-10-CM

## 2024-06-18 DIAGNOSIS — G43711 Chronic migraine without aura, intractable, with status migrainosus: Secondary | ICD-10-CM

## 2024-06-18 DIAGNOSIS — R2990 Unspecified symptoms and signs involving the nervous system: Secondary | ICD-10-CM

## 2024-06-18 DIAGNOSIS — R208 Other disturbances of skin sensation: Secondary | ICD-10-CM

## 2024-06-18 MED ORDER — GADOPICLENOL 0.5 MMOL/ML IV SOLN
10.0000 mL | Freq: Once | INTRAVENOUS | Status: AC | PRN
  Administered 2024-06-18: 10 mL via INTRAVENOUS

## 2024-06-19 ENCOUNTER — Ambulatory Visit: Payer: Self-pay | Admitting: Neurology

## 2024-06-28 NOTE — Procedures (Signed)
 [facility]  EEG (ELECTROENCEPHALOGRAM) REPORT  Courtney Patton FINES CLINICIAN: Dedra Gores, M.D.  TECHNOLOGIST: , Burnard Plummer REEGT TECHNIQUE:  This EEG study was performed according to the 10-20 International system of electrode placement.  Electrical activity was reviewed with band pass filter of 1-70Hz , sensitivity of 7 uV/mm, display speed of 28mm/sec with a 60Hz  notched filter applied as appropriate.  EEG data were recorded continuously and digitally stored.    Recoding duration :  25 minutes : 26 seconds Activation included:  Photic stimulation and Hyperventilation .      Description: The EEG's posterior dominant background rhythm of 11 hertz was symmetrically displayed while the patient's eyes were closed  and promptly attenuated with eye opening. At baseline, the recording showed a normal amplitude in symmetric fashion.  Photic stimulation was initiated at frequencies from 3- through 21 hertz, resulting in photic entrainment at  7 through 15 hertz  and did not provoke any periodic or rhythmic discharges, or epileptiform activity.  Hyperventilation maneuver was initiated,  leading to amplitude build-up, and slowing of central activity .  Following hyperventilation the patient's EEG was reviewed for a period of 1 and 2 minutes post maneuver, and showed  the patient's increasing drowsiness indicated by vertex sharp waves    The patient was recorded while awake, while drowsy     ECG: regular heart rate at 72-78 bpm.    IMPRESSION:  This EEG is a normal study without any evidence of epileptiform discharge.  Absence of epileptiform activity does not exclude past or future seizure activity.     Dr. Dedra Gores, M.D. Accredited by the ABPN, ABSM.

## 2024-08-07 ENCOUNTER — Encounter: Payer: Self-pay | Admitting: Physical Medicine & Rehabilitation

## 2024-08-07 ENCOUNTER — Encounter: Attending: Physical Medicine & Rehabilitation | Admitting: Physical Medicine & Rehabilitation

## 2024-08-07 VITALS — BP 115/69 | HR 112 | Ht 70.0 in | Wt 255.4 lb

## 2024-08-07 DIAGNOSIS — Z5181 Encounter for therapeutic drug level monitoring: Secondary | ICD-10-CM | POA: Insufficient documentation

## 2024-08-07 DIAGNOSIS — G894 Chronic pain syndrome: Secondary | ICD-10-CM | POA: Insufficient documentation

## 2024-08-07 DIAGNOSIS — Z79891 Long term (current) use of opiate analgesic: Secondary | ICD-10-CM | POA: Insufficient documentation

## 2024-08-07 DIAGNOSIS — M797 Fibromyalgia: Secondary | ICD-10-CM | POA: Insufficient documentation

## 2024-08-07 DIAGNOSIS — M533 Sacrococcygeal disorders, not elsewhere classified: Secondary | ICD-10-CM | POA: Diagnosis present

## 2024-08-07 NOTE — Progress Notes (Addendum)
 Subjective:    Patient ID: Courtney Patton, female    DOB: [birth year], 48 y.o.   MRN: [id number]  HPI Discussed the use of AI scribe software for clinical note transcription with the patient, who gave verbal consent to proceed.  History of Present Illness Courtney Patton is a 48 year old female with ankylosing spondylitis and fibromyalgia who presents for management of chronic pain and flare-ups. She was referred by her primary doctor and her pain management team in [facility] for local management of her conditions.  She has been experiencing chronic pain and flare-ups primarily affecting her neck and back, which have been exacerbated since contracting COVID-19 in 2020. These flare-ups have made her bedridden at times, impacting her ability to work consistently since 2020. She moved back to live with her mother due to health issues and financial constraints.  She has a history of ankylosing spondylitis, which was suspected by Dr. Dwan in 2007 and was well-managed until the onset of COVID-19. She reports increased inflammation and pain, particularly in her back, hips, and sacroiliac joints, with pain radiating to her piriformis area and groin. She experiences significant pain when standing for more than 30 seconds, describing it as feeling like her 'bones are broken.'  Her fibromyalgia was diagnosed prior to COVID-19 and was well-managed until the pandemic. She experiences widespread pain and has tried various medications, including pregabalin and gabapentin, which caused cognitive issues and swelling. She is currently on duloxetine for fibromyalgia management.  She has been on tramadol  ER in the morning and 100 mg of regular tramadol  at night for pain management. She also takes Flexeril , 5 mg in the morning and 10 mg at night, and meloxicam, which she attempted to taper off unsuccessfully. She had adverse reactions to Enbrel and Humira but has been on Rinvoq since August, which started helping  by the end of October, allowing her to stop Horizant.  She has a history of pulmonary embolism with blood clots in her lungs in April 2024, which left her bedridden for four months and led to job loss. She is not currently working and is awaiting disability approval.  She attempts to walk daily for 5-10 minutes, two to three times a day, and does stretching exercises when not in severe flare-ups. She previously found relief with physical therapy, including scraping, dry needling, and float therapy, but financial constraints limit her access to these treatments currently.   Pain Inventory Average Pain 6-7 Pain Right Now 6 My pain is intermittent, constant, sharp, burning, dull, stabbing, tingling, and aching  In the last 24 hours, has pain interfered with the following? General activity 10 Relation with others 7 Enjoyment of life 7 What TIME of day is your pain at its worst? morning , daytime, evening, and night Sleep (in general) Poor  Pain is worse with: walking, bending, sitting, inactivity, standing, and some activites Pain improves with: rest, heat/ice, therapy/exercise, pacing activities, medication, TENS, and injections, Graston Technigue, Dry Needling, Float Therapy, Cupping Relief from Meds: 5  walk without assistance ability to climb steps?  Yes but only a few steps do you drive?  Yes but only short distances, due to reduced sitting tolerance  not employed: date last employed 03/22/2023 currently filing for Disability benefits   problems in these areas weakness numbness tremor tingling trouble walking spasms dizziness confusion depression anxiety  Any changes since last visit?  yes  Primary care Lynwood Her, PA Rheumatologist Duwaine Hemp Neurologist:  Dedra Nian, MD  Family History  Problem Relation Age of Onset   Cancer Mother    COPD Mother    Breast cancer Maternal Aunt    Cancer Maternal Aunt    Breast cancer Maternal Aunt    Diabetes Maternal  Uncle    Cancer Other    Social History   Socioeconomic History   Marital status: Single    Spouse name: Not on file   Number of children: 0   Years of education: Not on file   Highest education level: Not on file  Occupational History   Not on file  Tobacco Use   Smoking status: Never   Smokeless tobacco: Never  Vaping Use   Vaping status: Never Used  Substance and Sexual Activity   Alcohol use: Never   Drug use: Never   Sexual activity: Not Currently    Birth control/protection: None  Other Topics Concern   Not on file  Social History Narrative   Right handed   Caffeine-1-2 cups daily   Social Drivers of Health   Financial Resource Strain: High Risk (01/25/2024)   Overall Financial Resource Strain (CARDIA)    Difficulty of Paying Living Expenses: Hard  Food Insecurity: Food Insecurity Present (01/25/2024)   Hunger Vital Sign    Worried About Running Out of Food in the Last Year: Sometimes true    Ran Out of Food in the Last Year: Never true  Transportation Needs: No Transportation Needs (01/25/2024)   PRAPARE - Administrator, Civil Service (Medical): No    Lack of Transportation (Non-Medical): No  Physical Activity: Inactive (01/25/2024)   Exercise Vital Sign    Days of Exercise per Week: 0 days    Minutes of Exercise per Session: 0 min  Stress: Stress Concern Present (01/25/2024)   [employer] of Occupational Health - Occupational Stress Questionnaire    Feeling of Stress : To some extent  Social Connections: Socially Isolated (01/25/2024)   Social Connection and Isolation Panel    Frequency of Communication with Friends and Family: Once a week    Frequency of Social Gatherings with Friends and Family: Once a week    Attends Religious Services: Never    Database Administrator or Organizations: No    Attends Engineer, Structural: Never    Marital Status: Never married   Past Surgical History:  Procedure Laterality Date   APPENDECTOMY   2020   COLONOSCOPY     CYSTOSCOPY     HYSTEROSCOPY WITH D & C     and LEEP   LAPAROSCOPY     Orthoscopy     Past Medical History:  Diagnosis Date   Anemia    Arthritis    Asthma    Chronic fatigue    Fibromyalgia    GERD (gastroesophageal reflux disease)    Hx of pulmonary embolus    Thyroid disease    Vaginal pain    BP 115/69 (BP Location: Left Arm, Patient Position: Sitting, Cuff Size: Large)   Pulse (!) 112   Ht 5' 10 (1.778 m)   Wt 255 lb 6.4 oz (115.8 kg)   SpO2 97%   BMI 36.65 kg/m   Opioid Risk Score:   Fall Risk Score:  `1  Depression screen PHQ 2/9     08/07/2024    2:00 PM 01/25/2024   10:56 AM 12/05/2019    1:34 PM  Depression screen PHQ 2/9  Decreased Interest 3 1 2   Down, Depressed, Hopeless 0 0  0  PHQ - 2 Score 3 1 2   Altered sleeping 3 3   Tired, decreased energy 3 3   Change in appetite 0 1   Feeling bad or failure about yourself  0 1   Trouble concentrating 2 3   Moving slowly or fidgety/restless 0 1   Suicidal thoughts 0 0   PHQ-9 Score 11 13    Difficult doing work/chores Extremely dIfficult       Data saved with a previous flowsheet row definition     Review of Systems  Constitutional:  Positive for fatigue.       Chronic Fatigue Syndrome  Respiratory:         Asthma  Cardiovascular:        History of PE  Gastrointestinal:  Positive for constipation.       GERD  Endocrine:       Hypothyroidism  Musculoskeletal:  Positive for arthralgias and myalgias.       Fibromyalgia, arthritis, widespread myalgia  Hematological:        Iron Deficiency Anemia       Objective:   Physical Exam General No acute distress Mood and affect appropriate Extremities without edema motor strength 5/5 bilateral deltoid bicep tricep grip as well as hip flexor knee extensor ankle dorsiflexor FABERs on the right causes groin pain Faber's on the left causes SI pain Thigh thrust negative bilaterally Mild tenderness over the PSIS  bilaterally Ambulates without assistive device no evidence of toe drag or knee debility Tone is normal in the upper and lower limbs       Assessment & Plan:   Assessment and Plan Assessment & Plan Ankylosing spondylitis with sacroiliac joint partial fusion and chronic low back pain Chronic low back pain with sacroiliac joint partial fusion, 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 Pain radiates from the SI joint to the piriformis area and hip, with associated groin pain. Limited ability to bend backwards since July. Rinvoq has been effective since August. Sacroiliac injections may be considered if partial fusion allows. - Referred to physical therapy for aquatic therapy and dry needling. - Ordered urine screen for tramadol  prescription. - Continue Rinvoq for ankylosing spondylitis.  Would recommend patient gets a local rheumatologist she has seen Dr. Dolphus in the past - Consider sacroiliac injections if partial fusion allows.  Fibromyalgia Chronic fibromyalgia with increased symptoms post-COVID. Currently managed with duloxetine. Aquatic therapy and float therapy have been beneficial. - Continue duloxetine for fibromyalgia. - Referred to physical therapy for aquatic therapy.  Chronic pain syndrome Managed with tramadol  ER, tramadol  regular, and Flexeril . Attempts to taper medications have been unsuccessful due to increased pain. Pain management is primarily through medication and physical therapy. - Continue tramadol  ER 200 mg daily and regular tramadol  100 mg nightly. - Ordered Flexeril  prescription.  5 mg in the morning and 10 mg at bedtime - Referred to physical therapy for aquatic therapy, dry needling Physical medicine rehab follow-up in 6 weeks UDS appropriate, may refill tramadol  when pt runs out of current Rx for ER and IR Unfortunately insurance, [employer] has denied the tramadol  ER and has recommended a trial of 2 of the following: Butrans patch, fentanyl  patch, methadone, morphine sulfate extended release, OxyContin prior to approving the tramadol  ER.   While the Butrans patch is a reasonable alternative, fentanyl morphine and OxyContin have a higher likelihood of respiratory depression as well as addiction potential as well as interfering with mental status.  The same goes  for methadone with the additional consideration of cardiac toxicity and risk for arrhythmia. We will try tramadol  100 mg 3 times daily.  As an alternative treatment to above.

## 2024-08-07 NOTE — Patient Instructions (Signed)
  VISIT SUMMARY: Today, we discussed the management of your chronic pain and flare-ups related to ankylosing spondylitis and fibromyalgia. We reviewed your current medications and discussed additional therapies to help manage your symptoms.  YOUR PLAN: ANKYLOSING SPONDYLITIS WITH SACROILIAC JOINT PARTIAL FUSION AND CHRONIC LOW BACK PAIN: You have chronic low back pain with sacroiliac joint partial fusion, which has been worsened by long COVID and recent blood clots. Your pain radiates from the SI joint to the piriformis area and hip, with associated groin pain. -Continue taking Rinvoq for ankylosing spondylitis. -We have referred you to physical therapy for aquatic therapy and dry needling. -We ordered a urine screen for your tramadol prescription. -We may consider sacroiliac injections if partial fusion allows.  FIBROMYALGIA: You have chronic fibromyalgia with increased symptoms post-COVID. Currently, it is managed with duloxetine. -Continue taking duloxetine for fibromyalgia. -We have referred you to physical therapy for aquatic therapy.  CHRONIC PAIN SYNDROME: Your chronic pain is managed with tramadol ER, regular tramadol, and Flexeril. Attempts to taper medications have been unsuccessful due to increased pain. -Continue taking tramadol ER and regular tramadol. -We have ordered a prescription for Flexeril. -We have referred you to physical therapy for pain management.                      Contains text generated by Abridge.                                 Contains text generated by Abridge.

## 2024-08-07 NOTE — Progress Notes (Deleted)
 Subjective:    Patient ID: Courtney Patton, female    DOB: [birth year], 48 y.o.   MRN: [id number]  HPI Pain Inventory Average Pain {NUMBERS; 0-10:5044} Pain Right Now {NUMBERS; 0-10:5044} My pain is {PAIN DESCRIPTION:21022940}  In the last 24 hours, has pain interfered with the following? General activity {NUMBERS; 0-10:5044} Relation with others {NUMBERS; 0-10:5044} Enjoyment of life {NUMBERS; 0-10:5044} What TIME of day is your pain at its worst? {time of day:24191} Sleep (in general) {BHH GOOD/FAIR/POOR:22877}  Pain is worse with: {ACTIVITIES:21022942} Pain improves with: {PAIN IMPROVES TPUY:78977056} Relief from Meds: {NUMBERS; 0-10:5044}  {MOBILITY JIO:78977055}  {FUNCTION:21022946}  {NEURO/PSYCH:21022948}  {CPRM PRIOR STUDIES:21022953}  {CPRM PHYSICIANS INVOLVED IN YOUR CARE:21022954}    Family History  Problem Relation Age of Onset   Cancer Mother    COPD Mother    Breast cancer Maternal Aunt    Cancer Maternal Aunt    Breast cancer Maternal Aunt    Diabetes Maternal Uncle    Cancer Other    Social History   Socioeconomic History   Marital status: Single    Spouse name: Not on file   Number of children: 0   Years of education: Not on file   Highest education level: Not on file  Occupational History   Not on file  Tobacco Use   Smoking status: Never   Smokeless tobacco: Never  Vaping Use   Vaping status: Never Used  Substance and Sexual Activity   Alcohol use: Never   Drug use: Never   Sexual activity: Not Currently    Birth control/protection: None  Other Topics Concern   Not on file  Social History Narrative   Right handed   Caffeine-1-2 cups daily   Social Drivers of Health   Financial Resource Strain: High Risk (01/25/2024)   Overall Financial Resource Strain (CARDIA)    Difficulty of Paying Living Expenses: Hard  Food Insecurity: Food Insecurity Present (01/25/2024)   Hunger Vital Sign    Worried About Running Out of Food in the Last  Year: Sometimes true    Ran Out of Food in the Last Year: Never true  Transportation Needs: No Transportation Needs (01/25/2024)   PRAPARE - Administrator, Civil Service (Medical): No    Lack of Transportation (Non-Medical): No  Physical Activity: Inactive (01/25/2024)   Exercise Vital Sign    Days of Exercise per Week: 0 days    Minutes of Exercise per Session: 0 min  Stress: Stress Concern Present (01/25/2024)   [employer] of Occupational Health - Occupational Stress Questionnaire    Feeling of Stress : To some extent  Social Connections: Socially Isolated (01/25/2024)   Social Connection and Isolation Panel    Frequency of Communication with Friends and Family: Once a week    Frequency of Social Gatherings with Friends and Family: Once a week    Attends Religious Services: Never    Database Administrator or Organizations: No    Attends Engineer, Structural: Never    Marital Status: Never married   Past Surgical History:  Procedure Laterality Date   APPENDECTOMY  2020   COLONOSCOPY     CYSTOSCOPY     HYSTEROSCOPY WITH D & C     and LEEP   LAPAROSCOPY     Orthoscopy     Past Medical History:  Diagnosis Date   Anemia    Arthritis    Asthma    Chronic fatigue    Fibromyalgia  GERD (gastroesophageal reflux disease)    Hx of pulmonary embolus    Thyroid disease    Vaginal pain    BP 115/69 (BP Location: Left Arm, Patient Position: Sitting, Cuff Size: Large)   Pulse (!) 112   Ht 5' 10 (1.778 m)   Wt 255 lb 6.4 oz (115.8 kg)   SpO2 97%   BMI 36.65 kg/m   Opioid Risk Score:   Fall Risk Score:  `1  Depression screen PHQ 2/9     08/07/2024    2:00 PM 01/25/2024   10:56 AM 12/05/2019    1:34 PM  Depression screen PHQ 2/9  Decreased Interest 3 1 2   Down, Depressed, Hopeless 0 0 0  PHQ - 2 Score 3 1 2   Altered sleeping 3 3   Tired, decreased energy 3 3   Change in appetite 0 1   Feeling bad or failure about yourself  0 1   Trouble  concentrating 2 3   Moving slowly or fidgety/restless 0 1   Suicidal thoughts 0 0   PHQ-9 Score 11 13    Difficult doing work/chores Extremely dIfficult       Data saved with a previous flowsheet row definition      Review of Systems  Respiratory:         Asthma  Cardiovascular:        History of PE  Gastrointestinal:        GERD  Endocrine:       Chronic Fatigue Hypothyroidism  Musculoskeletal:  Positive for arthralgias and myalgias.       Fibromyalgia, arthritis, widespread myalgias  Hematological:        Iron deficiency anemia       Objective:   Physical Exam        Assessment & Plan:

## 2024-08-13 LAB — TOXASSURE SELECT,+ANTIDEPR,UR

## 2024-08-15 ENCOUNTER — Encounter: Payer: Self-pay | Admitting: Physical Medicine & Rehabilitation

## 2024-08-15 ENCOUNTER — Telehealth: Payer: Self-pay

## 2024-08-15 MED ORDER — CYCLOBENZAPRINE HCL 5 MG PO TABS: 5.0000 mg | ORAL_TABLET | Freq: Two times a day (BID) | ORAL | 5 refills | Status: AC

## 2024-08-15 MED ORDER — TRAMADOL HCL ER 200 MG PO TB24: 200.0000 mg | ORAL_TABLET | Freq: Every day | ORAL | 5 refills | Status: DC

## 2024-08-15 NOTE — Telephone Encounter (Signed)
 Courtney Patton is calling back to see if you taking over the Cyclobenzaprine & Tramadol 200 MG? If so [facility] [place].

## 2024-08-22 ENCOUNTER — Encounter: Payer: Self-pay | Admitting: Physical Medicine & Rehabilitation

## 2024-08-22 ENCOUNTER — Telehealth: Payer: Self-pay

## 2024-08-22 NOTE — Telephone Encounter (Signed)
 PA for Tramadol  200 MG ER denied.   Patient will need to try Butrans Patch, Fentanyl Patch (09/06/49/100 mcg), morphine, methadone, morphine sulfate ER, OxyContin  - within the past year at a similar dose (a dose equivalent to the dose of the durg being requested).  A call has been made to the insurance company [phone] for review.  Notes and labs have been faxed again.  Along with additional information received from prior pain management practice.  Patient has been advised to try a Good Rx card for the medication refill. Until we hear back from the insurance company.

## 2024-08-23 NOTE — Telephone Encounter (Signed)
 Tramadol  200 MG ER has been approved 08/22/2024 to 11/20/2024. Patient has been informed.  Approval faxed to the pharmacy.

## 2024-08-30 NOTE — Addendum Note (Signed)
 Addended by: CARILYN PRENTICE BRAVO on: 08/30/2024 10:03 AM   Modules accepted: Orders

## 2024-09-17 ENCOUNTER — Telehealth: Payer: Self-pay

## 2024-09-17 NOTE — Telephone Encounter (Signed)
 Patient called stating she needs Tramadol  50 mg sent in for her to [facility]

## 2024-09-18 MED ORDER — TRAMADOL HCL 50 MG PO TABS: 100.0000 mg | ORAL_TABLET | Freq: Three times a day (TID) | ORAL | 5 refills | Status: DC

## 2024-09-19 ENCOUNTER — Encounter: Payer: Self-pay | Admitting: Physical Medicine & Rehabilitation

## 2024-09-20 ENCOUNTER — Telehealth: Payer: Self-pay

## 2024-09-20 ENCOUNTER — Encounter: Attending: Physical Medicine & Rehabilitation | Admitting: Physical Medicine & Rehabilitation

## 2024-09-20 ENCOUNTER — Encounter: Payer: Self-pay | Admitting: Physical Medicine & Rehabilitation

## 2024-09-20 VITALS — BP 124/63 | HR 108 | Ht 70.0 in | Wt 252.0 lb

## 2024-09-20 DIAGNOSIS — M797 Fibromyalgia: Secondary | ICD-10-CM | POA: Insufficient documentation

## 2024-09-20 DIAGNOSIS — M461 Sacroiliitis, not elsewhere classified: Secondary | ICD-10-CM | POA: Insufficient documentation

## 2024-09-20 DIAGNOSIS — M450 Ankylosing spondylitis of multiple sites in spine: Secondary | ICD-10-CM | POA: Insufficient documentation

## 2024-09-20 MED ORDER — TRAMADOL HCL ER 100 MG PO TB24: 200.0000 mg | ORAL_TABLET | Freq: Every day | ORAL | 5 refills | Status: DC

## 2024-09-20 MED ORDER — TRAMADOL HCL 50 MG PO TABS: 100.0000 mg | ORAL_TABLET | Freq: Every day | ORAL | 5 refills | Status: AC

## 2024-09-20 NOTE — Patient Instructions (Signed)
" °  VISIT SUMMARY: Today, we addressed your worsening sacroiliac joint pain, chronic tailbone pain, fibromyalgia, and neck and shoulder pain. We performed maneuvers to localize your sacroiliac pain and planned a right sacroiliac joint injection. We also discussed your current medications and potential new treatments for your pain management.  YOUR PLAN: SACROILIITIS ASSOCIATED WITH ANKYLOSING SPONDYLITIS: You have chronic sacroiliitis with worsening pain on the right side, and imaging shows early sacroiliitis and anatomic variation of the sacroiliac joint. -We performed maneuvers to localize your sacroiliac pain. -We ordered a right sacroiliac joint injection for pain management. -Continue taking Eliquis as usual for the procedure. -Consider taking Valium for anxiety before the procedure, but you will need a driver if you do.  COCCYDYNIA: You have chronic tailbone pain likely due to inflammation and possible anatomic variation, which makes sitting uncomfortable. -Try to minimize pressure on your tailbone as much as possible.  FIBROMYALGIA: You have longstanding fibromyalgia with chronic pain managed with tramadol  ER, regular tramadol , and cyclobenzaprine . -We have discontinued your regular tramadol  prescription and adjusted the quantity as needed. -Your confirmed regimen is tramadol  ER 200 mg daily and tramadol  100 mg at bedtime. -Continue taking cyclobenzaprine  as prescribed.  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 -We discussed trigger point injections as a treatment option for pain that does not respond to other treatments.                      Contains text generated by Abridge.                                 Contains text generated by Abridge.   "

## 2024-09-20 NOTE — Telephone Encounter (Signed)
 Pre Procedure Form completed, reviewed and discussed with patient.  Copy of pre procedure form given to patient

## 2024-09-20 NOTE — Progress Notes (Signed)
 "  Subjective:    Patient ID: Courtney Patton, female    DOB: 07-05-1976, 49 y.o.   MRN: [id number]  HPI Discussed the use of AI scribe software for clinical note transcription with the patient, who gave verbal consent to proceed.  History of Present Illness Courtney Patton is a 49 year old female with ankylosing spondylitis, sacroiliitis, and fibromyalgia who presents for evaluation of worsening bilateral sacroiliac joint pain and chronic pain management.  Sacroiliac joint pain has been present since January 2005, with significant worsening since October 2025. Pain is bilateral, more severe on the right, with recent increased symptoms on the left. Pain radiates from the sacroiliac joint to the groin, back, and down the leg, and also involves the area adjacent to the coccyx. Sitting for more than five minutes exacerbates pain, requiring significant cushioning, and she is unable to sit or stand in place for prolonged periods. She also has difficulty lying on either side or on her abdomen due to pain.  She experiences chronic tailbone pain, which is aggravated by sitting and contributes to her inability to sit comfortably for more than a few minutes. Pain is persistent and worsened by pressure on the area.  She also has chronic neck and shoulder pain with associated migraines, which have worsened over the past week. Pain is typically at a trigger point near the inferior right scapula but recently radiated to the left side with minimal movement, causing difficulty with neck flexion. The area remains tight and is sometimes triggered by dry needling, which also provokes headaches. She uses ice and massage for relief, and symptoms tend to flare for several days before subsiding.  She is currently taking tramadol  ER 200 mg in the morning and tramadol  100 mg (two tablets) at night for pain control, as well as cyclobenzaprine . She has not taken diazepam recently due to lack of efficacy. She has not previously  received sacroiliac joint injections but has had hip injections. 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  She has not started physical therapy due to financial constraints, despite Medicaid coverage for a limited number of visits. She drives herself to medical appointments and for weekly grocery pickups, but otherwise has limited activity due to pain.   Pain Inventory Average Pain 8 Pain Right Now 7 My pain is intermittent, constant, sharp, burning, dull, stabbing, tingling, and aching  In the last 24 hours, has pain interfered with the following? General activity 10 Relation with others 5 Enjoyment of life 8 What TIME of day is your pain at its worst? morning , daytime, evening, and night Sleep (in general) Fair  Pain is worse with: walking, bending, sitting, inactivity, standing, and some activites Pain improves with: rest, heat/ice, therapy/exercise, pacing activities, medication, and TENS Relief from Meds: 5  Family History  Problem Relation Age of Onset   Cancer Mother    COPD Mother    Breast cancer Maternal Aunt    Cancer Maternal Aunt    Breast cancer Maternal Aunt    Diabetes Maternal Uncle    Cancer Other    Social History   Socioeconomic History   Marital status: Single    Spouse name: Not on file   Number of children: 0   Years of education: Not on file   Highest education level: Not on file  Occupational History   Not on file  Tobacco Use   Smoking  status: Never   Smokeless tobacco: Never  Vaping Use   Vaping status: Never Used  Substance and Sexual Activity   Alcohol use: Never   Drug use: Never   Sexual activity: Not Currently    Birth control/protection: None  Other Topics Concern   Not on file  Social History Narrative   Right handed   Caffeine-1-2 cups daily   Social Drivers of Health   Tobacco Use: Low Risk (08/07/2024)   Patient History     Smoking Tobacco Use: Never    Smokeless Tobacco Use: Never    Passive Exposure: Not on file  Financial Resource Strain: High Risk (01/25/2024)   Overall Financial Resource Strain (CARDIA)    Difficulty of Paying Living Expenses: Hard  Food Insecurity: Food Insecurity Present (01/25/2024)   Hunger Vital Sign    Worried About Running Out of Food in the Last Year: Sometimes true    Ran Out of Food in the Last Year: Never true  Transportation Needs: No Transportation Needs (01/25/2024)   PRAPARE - Administrator, Civil Service (Medical): No    Lack of Transportation (Non-Medical): No  Physical Activity: Inactive (01/25/2024)   Exercise Vital Sign    Days of Exercise per Week: 0 days    Minutes of Exercise per Session: 0 min  Stress: Stress Concern Present (01/25/2024)   [employer] of Occupational Health - Occupational Stress Questionnaire    Feeling of Stress : To some extent  Social Connections: Socially Isolated (01/25/2024)   Social Connection and Isolation Panel    Frequency of Communication with Friends and Family: Once a week    Frequency of Social Gatherings with Friends and Family: Once a week    Attends Religious Services: Never    Database Administrator or Organizations: No    Attends Banker Meetings: Never    Marital Status: Never married  Depression (PHQ2-9): High Risk (08/07/2024)   Depression (PHQ2-9)    PHQ-2 Score: 11  Alcohol Screen: Low Risk (01/25/2024)   Alcohol Screen    Last Alcohol Screening Score (AUDIT): 0  Housing: Low Risk (01/25/2024)   Housing Stability Vital Sign    Unable to Pay for Housing in the Last Year: No    Number of Times Moved in the Last Year: 1    Homeless in the Last Year: No  Utilities: Not At Risk (01/25/2024)   AHC Utilities    Threatened with loss of utilities: No  Health Literacy: Not on file   Past Surgical History:  Procedure Laterality Date   APPENDECTOMY  2020   COLONOSCOPY     CYSTOSCOPY      HYSTEROSCOPY WITH D & C     and LEEP   LAPAROSCOPY     Orthoscopy     Past Surgical History:  Procedure Laterality Date   APPENDECTOMY  2020   COLONOSCOPY     CYSTOSCOPY     HYSTEROSCOPY WITH D & C     and LEEP   LAPAROSCOPY     Orthoscopy     Past Medical History:  Diagnosis Date   Anemia    Arthritis    Asthma    Chronic fatigue    Fibromyalgia    GERD (gastroesophageal reflux disease)    Hx of pulmonary embolus    Thyroid disease    Vaginal pain    BP 124/63   Pulse (!) 108   Ht 5' 10 (1.778 m)   Wt 252 lb (  114.3 kg)   BMI 36.16 kg/m   Opioid Risk Score:   Fall Risk Score:  `1  Depression screen PHQ 2/9     08/07/2024    2:00 PM 01/25/2024   10:56 AM 12/05/2019    1:34 PM  Depression screen PHQ 2/9  Decreased Interest 3 1 2   Down, Depressed, Hopeless 0 0 0  PHQ - 2 Score 3 1 2   Altered sleeping 3 3   Tired, decreased energy 3 3   Change in appetite 0 1   Feeling bad or failure about yourself  0 1   Trouble concentrating 2 3   Moving slowly or fidgety/restless 0 1   Suicidal thoughts 0 0   PHQ-9 Score 11 13    Difficult doing work/chores Extremely dIfficult       Data saved with a previous flowsheet row definition     Review of Systems  Musculoskeletal:  Positive for myalgias.  All other systems reviewed and are negative.      Objective:   Physical Exam General No acute distress Mood and affect mildly anxious Extremities are without edema Ambulates without assistive device no evidence of toe drag or knee instability Her left trapezius has tenderness palpation.  She has some radiation toward her neck area as well. Right trapezius tenderness and tightness but no pain. Low back is tenderness around L5 area down to S1-S2. There is tenderness at the right PSIS  Sacral thrust (prone) : Positive on the right side Lateral compression: Negative FABER's: Positive on right side Distraction (supine): Negative Thigh thrust test: Positive on the  right side       Assessment & Plan:  Assessment and Plan Assessment & Plan Sacroiliitis associated with ankylosing spondylitis Chronic sacroiliitis with worsening right-sided pain, early sacroiliitis on imaging, and anatomic variation of the sacroiliac joint. Sacroiliac joint injections are effective and well tolerated. No need to interrupt Eliquis. Valium for anxiolysis is an option with a driver. - Performed sacroiliac joint provocative maneuvers to localize pain. - Ordered right sacroiliac joint injection for pain management. - Advised continuation of Eliquis for the procedure. - Discussed pre-procedural Valium for anxiety, with requirement for a driver if taken.  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 - Advised minimizing pressure on the coccyx as primary management.  Fibromyalgia Longstanding fibromyalgia with chronic pain managed with tramadol  ER, regular tramadol , and cyclobenzaprine . Tramadol  ER provides stable analgesia. - Discontinued regular tramadol  prescription and adjusted quantity as appropriate. - Confirmed regimen: tramadol  ER 200 mg daily and tramadol  100 mg at bedtime. - Confirmed cyclobenzaprine  dosing and refills.  Myofascial pain syndrome of the neck and shoulder Intermittent myofascial pain with trapezius trigger points radiating to the head and retro-orbital region, occasionally causing headaches. Managed with ice and massage, but flare-ups persist. - Discussed trigger point injections as a treatment option for refractory pain.    "

## 2024-09-24 ENCOUNTER — Encounter: Payer: Self-pay | Admitting: Neurology

## 2024-09-24 ENCOUNTER — Ambulatory Visit: Admitting: Neurology

## 2024-09-24 VITALS — BP 119/76 | HR 87 | Ht 71.0 in | Wt 253.0 lb

## 2024-09-24 DIAGNOSIS — F4542 Pain disorder with related psychological factors: Secondary | ICD-10-CM | POA: Diagnosis not present

## 2024-09-24 DIAGNOSIS — M797 Fibromyalgia: Secondary | ICD-10-CM

## 2024-09-24 DIAGNOSIS — G43011 Migraine without aura, intractable, with status migrainosus: Secondary | ICD-10-CM | POA: Diagnosis not present

## 2024-09-24 DIAGNOSIS — M450 Ankylosing spondylitis of multiple sites in spine: Secondary | ICD-10-CM | POA: Diagnosis not present

## 2024-09-24 MED ORDER — NURTEC 75 MG PO TBDP: 75.0000 mg | ORAL_TABLET | Freq: Every day | ORAL | 5 refills | Status: DC

## 2024-09-24 MED ORDER — PREDNISONE 10 MG PO TABS: ORAL_TABLET | ORAL | 1 refills | Status: AC

## 2024-09-24 MED ORDER — AMITRIPTYLINE HCL 25 MG PO TABS: 25.0000 mg | ORAL_TABLET | Freq: Every day | ORAL | 3 refills | Status: DC

## 2024-09-24 NOTE — Patient Instructions (Addendum)
 ADL - endorses being affected in many aspects of daily living, : Transporatation,  financial,    1)  debilitating chronic migraine , was temporarily helped by prednisone  dose pack and Topiramate .  She is anxious, depressed , and has self reported memory concerns.  2)  chronic pain overall- on tramadol , flexaril, all sedating medication.  3) chronic inflammation  with ankylosing spondylitis.   On Enbrel, doing better than on HUMIRA !   4) word finding problems, worsening while on Topiramate . Will no longer use this.   Memory Compensation Strategies  Use WARM strategy.  W= write it down  A= associate it  R= repeat it  M= make a mental note  2.   You can keep a Glass Blower/designer.  Use a 3-ring notebook with sections for the following: calendar, important names and phone numbers,  medications, doctors' names/phone numbers, lists/reminders, and a section to journal what you did  each day.   3.    Use a calendar to write appointments down.  4.    Write yourself a schedule for the day.  This can be placed on the calendar or in a separate section of the Memory Notebook.  Keeping a  regular schedule can help memory.  5.    Use medication organizer with sections for each day or morning/evening pills.  You may need help loading it  6.    Keep a basket, or pegboard by the door.  Place items that you need to take out with you in the basket or on the pegboard.  You may also want to  include a message board for reminders.  7.    Use sticky notes.  Place sticky notes with reminders in a place where the task is performed.  For example:  turn off the  stove placed by the stove, lock the door placed on the door at eye level,  take your medications on  the bathroom mirror or by the place where you normally take your medications.  8.    Use alarms/timers.  Use while cooking to remind yourself to check on food or as a reminder to take your medicine, or as a  reminder to make a call, or as a reminder  to perform another task, etc.     Plan:  Treatment plan and additional workup planned after today includes:   1)  try elavil  - Amytriptyline for HS, may have potential for worsening   dry mouth and eyes, but she needs to fail these med before she can get on Ajovy or Emgality.   I will start prevention by  Nurtec  daily 75 mg po.     She will stay on tramadol  per pain management .    Flexeril  is to be used parallel, was meant for back and neck pain.   Cymbalta is not prescribed by me - and will stay )     Keep HA journal.    Botox interested , but has to fail several drugs in various capacities as preventives and abortive.   Abortive prednisone  dose pack  for chronic intractable migraine and neck pain-  10 mg written 30 mg day 1-3, 20 mg day 4-6 and 10 mg the rest of the pack in AM with food.   I added a script for PT, neck spine. In [place], [place].

## 2024-09-24 NOTE — Progress Notes (Addendum)
 [facility]  Sleep Clinic   Provider:  Dedra Gores, MD  Referring Provider: No ref. provider found Primary Care Physician:  Kay Lynwood BIRCH, PA-C  Chief Complaint  Patient presents with   Migraine    Rm 2 alone  Pt is well, reports increase in migraines since last visit. She has daily headaches and about 2-3 migraines a week.  Topamax  nor Maxalt  provides any relief.  She also mentions trouble with vision and feels her eyes are jumping.     HPI:  Courtney Patton is a 49 y.o. female and seen here on 09/24/2024 upon referral from  found for a Consultation/ Evaluation of  headaches and  memory concerns, no memory test was done here.  She reports more than 15 headaches a months , chronic intractable migraines with status migrainosus.  She failed OTC, NSAIDS, tylenol, biofeedback, dry needle, magnesium,  imitrex, betablockers and topiramate  ( it worked shortly 2-3 weeks ).   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 She now wants to apply for disability. Her headaches have been triggered by stress, they are triggered by anxiety.  Psychological factors  contribute to the intensity and frequency .  Her HST was negative for apnea and  hypoxia. Her  EEG is a normal study without any evidence of epileptiform discharge.  Absence of epileptiform activity does not exclude past or future seizure activity.    I had no intention to follow up in my clinic,  but we arranged for NCV and EMG due to paresthesias report.      She had been on 20 mg prednisone  after our first visit, rheumatology had started her for  rheumatoid arthritis- ankylosing spondylitis,   sacroiliitis , which has been self-named fibromyalgia, has  joint and muscle pain. It helped her thought process, she felt back to her  baseline.  ANA positive.  Follows with pain management and is interested in a skin biopsy for small fiber neuropathy.  She also wonders if the spondylitis affects  her neck  now more, should there be another image .    Dr Onita saw the patient upon my request as a neuromuscular specialist :  Courtney Patton is a 49 y.o. female   Long history of intermittent limb paresthesia, body achy pain,             EMG nerve conduction study June 06, 2024 showed mild bilateral carpal tunnel syndromes was otherwise normal, there was no evidence of large fiber peripheral neuropathy or intrinsic muscle disease.             Laboratory evaluation to demonstrate positive ANA with 1:160 titer,             She is to continue follow-up with her primary care if needed rheumatologist MEDICAL HISTORY:   Courtney Patton is a 49 year old female, seen in request by Toree Edling for evaluation of intermittent limb paresthesia   History is obtained from the patient and review of electronic medical records. I personally reviewed pertinent available imaging films in PACS.    PMHx of  Hypothyrodism Obesity,  Hx of pulmonary embolus,    Since her COVID in 2022, she developed bilateral lower extremity numb tingling burning pain, occasionally hands involvement, get much worse since April 2025, she complains of diffuse body achy pain, intermittent limb paresthesia, put significant limitation in her daily activity   She also had a history of pulmonary emboli receiving Eliquis, taking Cymbalta, tramadol , muscle  relaxant, even tramadol , Valium as needed provide limited help to her symptoms   Extensive laboratory evaluation 2025 showed normal or negative ESR, C-reactive protein, protein electrophoresis, A1c, TSH, B12, CMP, CBC, methylmalonic acid level, positive ANA with speckled pattern 1-160     My  first visit - CD Paper referral from PCP for migraines - first referral on 02-13-2024-  .  Not first referral for the patient. In Sept 2024, pain worsened all over, especially nerve pain. Began having difficulty with Right leg - has ongoing low back pain issues. States starts in hip and goes to  knee. Has nerve pain in feet but worse in Right foot. Also has neck pain and has been to PT. Had PE last April 2024 - lost job d/t being out with that.  Moved to this area in July of 2024. Pt says all these issues began after having COVID 09/2018, and long COVID began after that in [place]      HPI:  Courtney Patton is a 49 y.o. female  with fibromyalgia and chronic pain, ankylosing spondylitis, and was well controlled until she contracted COVID in 1-24- 2020 and  developed since multiple disabilities, long Covid,  chest pain, SOB,  culminated in a Pulmonary Embolism  in 12-2022, , etc. She has multiple joint pain and is in pain management, cognitive decline has not been formally addressed. Since then she has GERD, gasping and sleeps propped up now- uses a wedge .  She reported Orthostatic dizziness.    This patient is seen here upon referral from PA  [place] for a Consultation/ Evaluation of  HEADACHES, Migraines, she reports 15/ months and other headaches , almost daily Nausea, photophobia. Also neck tension pain. Intractable.  She reported having had only one headache free day in January 2025, the pain can also be a sharp and stabbing pain into the right temple and into the eye-  this in daytime.  Sometimes she can sleep migraines off.  She never has been tested for sleep apnea.    Family history : MGM , mother had migraines.  She has a brother and sister with tension headaches.  Mother has rheumatoid arthitis,  mother has anxiety/ depression as did MGM, psoriatic arthritis in a brother.  MGF Had RA.  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  No ETOH, No Nicotine, but Caffeine :  1 coke in AM.    Review of Systems: Out of a complete 14 system review, the patient complains of only the following symptoms, and all other reviewed systems are negative.  Social History   Socioeconomic History   Marital status: Single    Spouse name: Not on file   Number of  children: 0   Years of education: Not on file   Highest education level: Not on file  Occupational History   Not on file  Tobacco Use   Smoking status: Never   Smokeless tobacco: Never  Vaping Use   Vaping status: Never Used  Substance and Sexual Activity   Alcohol use: Never   Drug use: Never   Sexual activity: Not Currently    Birth control/protection: None  Other Topics Concern   Not on file  Social History Narrative   Right handed   Caffeine-1-2 cups daily   Social Drivers of Health   Tobacco Use: Low Risk (09/24/2024)   Patient History    Smoking Tobacco Use: Never    Smokeless Tobacco Use:  Never    Passive Exposure: Not on file  Financial Resource Strain: High Risk (01/25/2024)   Overall Financial Resource Strain (CARDIA)    Difficulty of Paying Living Expenses: Hard  Food Insecurity: Food Insecurity Present (01/25/2024)   Hunger Vital Sign    Worried About Running Out of Food in the Last Year: Sometimes true    Ran Out of Food in the Last Year: Never true  Transportation Needs: No Transportation Needs (01/25/2024)   PRAPARE - Administrator, Civil Service (Medical): No    Lack of Transportation (Non-Medical): No  Physical Activity: Inactive (01/25/2024)   Exercise Vital Sign    Days of Exercise per Week: 0 days    Minutes of Exercise per Session: 0 min  Stress: Stress Concern Present (01/25/2024)   [employer] of Occupational Health - Occupational Stress Questionnaire    Feeling of Stress : To some extent  Social Connections: Socially Isolated (01/25/2024)   Social Connection and Isolation Panel    Frequency of Communication with Friends and Family: Once a week    Frequency of Social Gatherings with Friends and Family: Once a week    Attends Religious Services: Never    Database Administrator or Organizations: No    Attends Banker Meetings: Never    Marital Status: Never married  Intimate Partner Violence: Not At Risk (01/25/2024)    Humiliation, Afraid, Rape, and Kick questionnaire    Fear of Current or Ex-Partner: No    Emotionally Abused: No    Physically Abused: No    Sexually Abused: No  Depression (PHQ2-9): High Risk (08/07/2024)   Depression (PHQ2-9)    PHQ-2 Score: 11  Alcohol Screen: Low Risk (01/25/2024)   Alcohol Screen    Last Alcohol Screening Score (AUDIT): 0  Housing: Low Risk (01/25/2024)   Housing Stability Vital Sign    Unable to Pay for Housing in the Last Year: No    Number of Times Moved in the Last Year: 1    Homeless in the Last Year: No  Utilities: Not At Risk (01/25/2024)   AHC Utilities    Threatened with loss of utilities: No  Health Literacy: Not on file    Family History  Problem Relation Age of Onset   Cancer Mother    COPD Mother    Breast cancer Maternal Aunt    Cancer Maternal Aunt    Breast cancer Maternal Aunt    Diabetes Maternal Uncle    Cancer Other     Past Medical History:  Diagnosis Date   Anemia    Arthritis    Asthma    Chronic fatigue    Fibromyalgia    GERD (gastroesophageal reflux disease)    Hx of pulmonary embolus    Thyroid disease    Vaginal pain     Past Surgical History:  Procedure Laterality Date   APPENDECTOMY  2020   COLONOSCOPY     CYSTOSCOPY     HYSTEROSCOPY WITH D & C     and LEEP   LAPAROSCOPY     Orthoscopy      Current Outpatient Medications  Medication Sig Dispense Refill   apixaban (ELIQUIS) 5 MG TABS tablet      cyclobenzaprine  (FLEXERIL ) 5 MG tablet Take 1 tablet (5 mg total) by mouth 2 (two) times daily. Take 1 tab (5 mg) in the morning and 2 tabs (10 mg) at bedtime 90 tablet 5   diazepam (VALIUM) 5 MG tablet Take  5 mg by mouth daily as needed.     DULoxetine (CYMBALTA) 60 MG capsule Take 60 mg by mouth daily.     esomeprazole (NEXIUM) 20 MG capsule      fluticasone (FLONASE) 50 MCG/ACT nasal spray Place into the nose.     levothyroxine (SYNTHROID) 175 MCG tablet Take 175 mcg by mouth daily before breakfast.      Loratadine 10 MG CAPS Take 1 capsule by mouth every morning.     meloxicam (MOBIC) 15 MG tablet Take 15 mg by mouth daily.     RINVOQ 15 MG TB24 Take 15 mg by mouth daily.     rizatriptan  (MAXALT -MLT) 10 MG disintegrating tablet Take 1 tablet (10 mg total) by mouth as needed for migraine. May repeat in 2 hours if needed 10 tablet 5   traMADol  (ULTRAM ) 50 MG tablet Take 2 tablets (100 mg total) by mouth at bedtime. 60 tablet 5   traMADol  (ULTRAM -ER) 100 MG 24 hr tablet Take 2 tablets (200 mg total) by mouth daily. 30 tablet 5   No current facility-administered medications for this visit.    Allergies as of 09/24/2024 - Review Complete 09/24/2024  Allergen Reaction Noted   Codeine Shortness Of Breath 10/15/2010   Etodolac  12/27/2013   Nsaids  10/15/2010    Vitals: BP 119/76   Pulse 87   Ht 5' 11 (1.803 m)   Wt 253 lb (114.8 kg)   BMI 35.29 kg/m   @No  data found.    Physical exam:  General: The patient is awake, alert and appears not in acute distress.  The patient is well groomed. Head: Normocephalic, atraumatic.  Neck is supple.  Slight hump-  No Goiter   Neck circumference: 17.5   Cardiovascular:  Regular rate and palpable peripheral pulse:  Respiratory: clear to auscultation.  Mallampati 3, Skin:  Without evidence of edema, or rash, prednisone   weight gain.  Trunk: BMI is 35. 3 down from 38 (!)  and patient  has normal posture.     Neurologic exam : The patient is awake and alert, oriented to place and time.  Memory subjective impaired;  There is a normal attention span & concentration ability.  Speech is fluent without  dysarthria, dysphonia or aphasia.  Mood and affect are pleading, anxious.    Cranial nerves: Pupils are equal and briskly reactive to light. Vision  acuity  is decreasing for reading.  Funduscopic exam without  evidence of pallor or edema.  Extraocular movements  in vertical and horizontal planes intact and without nystagmus. Visual fields by  finger perimetry are intact. Hearing to finger rub intact.  Facial sensation intact to fine touch.  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    Motor exam:   Normal tone and normal muscle bulk and symmetric normal strength in all extremities. Grip Strength equal and she trembles when she squeezes.  Proximal strength of shoulder muscles and hip flexors was intact .     Assessment: Total time for face to face interview and examination, for review of  images and laboratory testing, neurophysiology testing and pre-existing records, including out-of -network , was 45 minutes. Assessment is as follows here:  ADL - endorses being affected in many aspects of daily living, : Transporatation,  financial,    1)  debilitating chronic migraine , was temporarily helped by prednisone  dose pack  and Topiramate .  She is anxious, depressed , and has self reported memory concerns.  2)  chronic pain overall- on tramadol , flexaril, all sedating medication.  3) chronic inflammation  with ankylosing spondylitis.   On Enbrel, doing better than on HUMIRA !   4) word finding problems, worsening while on Topiramate . Will no longer use this.    Plan:  Treatment plan and additional workup planned after today includes:   1)  try Elavil  - Amytriptyline for HS, may have potential for worsening dry mouth and eyes, but she needs to fail these med before she can get on Ajovy or Emgality .  Thsi medication may be taken at night, Flexeril  cannot be taken at night while elavil  is used.    Ajovy or Emgality  for prevention are my goal.   I will start prevention by  Nurtec  every other day 75 mg po, hope we can get to that without having Emgality  to fail first. .    She will stay on tramadol  per pain management .   Flexeril  is to be used parallel ( not at night time ) , was meant for back and neck pain.   Cymbalta is not prescribed by me - and will stay )     Keep HA journal.     Abortive prednisone  dose pack  for chronic intractable migraine and neck pain-  10 mg written 30 mg day 1-3, 20 mg day 4-6 and 10 mg the rest of the pack in AM with food.   I added a script for PT, neck spine. In [place], [place].    Memory gets better when less stress and less pain are present, less inflammation.  Botox interested , but has to fail several drugs in various capacities as preventives and abortive.     Helpful Websites: www.AmericanHeadacheSociety.org patenthood.ch www.headaches.org tightmarket.nl www.achenet.vickey Dedra Gores, MD  [facility] Board certified by The [employer] of Sleep Medicine and Diplomate of the [employer] of Sleep Medicine. Board certified In Neurology through the ABPN, Fellow of the [employer] of Neu[employer].

## 2024-09-26 ENCOUNTER — Telehealth: Payer: Self-pay | Admitting: *Deleted

## 2024-09-26 ENCOUNTER — Telehealth: Payer: Self-pay | Admitting: Pharmacist

## 2024-09-26 ENCOUNTER — Encounter: Payer: Self-pay | Admitting: Physical Medicine & Rehabilitation

## 2024-09-26 MED ORDER — NURTEC 75 MG PO TBDP: 75.0000 mg | ORAL_TABLET | ORAL | 5 refills | Status: DC

## 2024-09-26 NOTE — Telephone Encounter (Signed)
 Pt medical records ready for p/u @ the front desk. I called pt not able to leave a message

## 2024-09-26 NOTE — Telephone Encounter (Signed)
 The notes indicate the patient will be starting preventive therapy with Nurtec. Could you please clarify the dosing? The current instructions state one tablet daily; however, FDA-approved preventive dosing is one tablet every other day.   Thank you, Devere Pandy, PharmD Clinical Pharmacist  [place]  Direct Dial: [phone]

## 2024-09-27 ENCOUNTER — Other Ambulatory Visit (HOSPITAL_COMMUNITY): Payer: Self-pay

## 2024-09-27 NOTE — Telephone Encounter (Signed)
 Pharmacy Patient Advocate Encounter  Received notification from [employer] that Prior Authorization for Nurtec has been DENIED.  Full denial letter will be uploaded to the media tab. See denial reason below.   PA #/Case ID/Reference #: [id number]

## 2024-09-27 NOTE — Telephone Encounter (Signed)
 Pharmacy Patient Advocate Encounter   Received notification from Patient Advice Request messages that prior authorization for Nurtec is required/requested.   Insurance verification completed.   The patient is insured through [employer].   Per test claim: PA required; PA submitted to above mentioned insurance via Latent Key/confirmation #/EOC [id number] Status is pending

## 2024-09-28 ENCOUNTER — Encounter: Payer: Self-pay | Admitting: Neurology

## 2024-10-01 MED ORDER — EMGALITY 120 MG/ML ~~LOC~~ SOSY: 1.1200 mL | PREFILLED_SYRINGE | SUBCUTANEOUS | 5 refills | Status: AC

## 2024-10-01 NOTE — Addendum Note (Signed)
 Addended by: CHALICE SAUNAS on: 10/01/2024 01:19 PM   Modules accepted: Orders

## 2024-10-03 ENCOUNTER — Telehealth: Payer: Self-pay | Admitting: Pharmacist

## 2024-10-03 NOTE — Telephone Encounter (Signed)
 Pharmacy Patient Advocate Encounter   Received notification from [facility] that prior authorization for Emgality  120MG /ML syringes (migraine) is required/requested.   Insurance verification completed.   The patient is insured through [employer].   Per test claim: PA required; PA submitted to above mentioned insurance via Latent Key/confirmation #/EOC [facility] Status is pending

## 2024-10-03 NOTE — Telephone Encounter (Signed)
 Pharmacy Patient Advocate Encounter  Received notification from [employer] that Prior Authorization for Galcanezumab -gnlm (EMGALITY ) 120 MG/ML SOSY has been APPROVED from 10/03/2024 to 01/01/2025   PA #/Case ID/Reference #: [id number]

## 2024-10-05 ENCOUNTER — Encounter: Payer: Self-pay | Admitting: Physical Medicine & Rehabilitation

## 2024-10-06 ENCOUNTER — Telehealth: Payer: Self-pay | Admitting: Neurology

## 2024-10-06 MED ORDER — RIZATRIPTAN BENZOATE 10 MG PO TBDP: 10.0000 mg | ORAL_TABLET | ORAL | 5 refills | Status: AC | PRN

## 2024-10-06 MED ORDER — VERAPAMIL HCL ER 120 MG PO TBCR: 120.0000 mg | EXTENDED_RELEASE_TABLET | Freq: Every day | ORAL | 2 refills | Status: AC

## 2024-10-06 MED ORDER — NURTEC 75 MG PO TBDP: 75.0000 mg | ORAL_TABLET | ORAL | 5 refills | Status: AC

## 2024-10-06 NOTE — Telephone Encounter (Signed)
 Preauthorization Documentation.  January 2026.   This patient has a complicated medical history with ankylosis spondylitis, myalgia, nerve pain and PE, anticoagulated on Eloquis,  She not a good candidate for triptan meds- hemiparesis, but had been on Maxalt  ( as above )     Gabapentin did not help, nor robaxin,  incomplete relief or temporarily from as did tramadol  , flexaril ( maintained by Dr Gunnar) , and cymbalta.  prednisone  tapers have given temporary relief , we repeated one recently      I had ordered Emgality  for prevention ( after Nurtec preventive every other day was denied ) I have ordered Nurtec as abortive( was denied ) .   I had to try amitriptyline  - Has failed amitriptyline  now , making her headaches worse.   I only see one other possible prevention we need to cover , a calcium channel blocker. Calan  120 mg at night. 30 days trial.      If no success again, will reorder Emgality  for prevention ( after Nurtec preventive every other day was denied ) and  I will again order Nurtec as abortive( was denied ) . I still hope the patient gets to use these treatments  The patient is in discomfort, pain and should be considered to gain from preventive use of  emgality  or ajovy.   She may benefit from vyepti as plan B, ( see circulatory side effects and other below: she is already on mononuclear abn treatment for ankylosing spondylitis- ) or may benefit  from Botox.      Dedra Gores, MD    VYEPTI may cause serious side effects such as:  Allergic reactions. Call your healthcare provider or get emergency medical help right away if you have any symptoms of an allergic reaction: rash; swelling of your face, lips, tongue, or throat; if you have trouble breathing; hives; or redness in your face. High blood pressure. High blood pressure or worsening of high blood pressure can happen after receiving VYEPTI. Contact your healthcare provider if you have an increase in blood  pressure. Raynauds phenomenon. A type of circulation problem (Raynauds phenomenon) can worsen or happen after receiving VYEPTI. Contact your healthcare provider if you have symptoms such as your fingers or toes feeling numb, cool, or painful, or changing color from pale to blue to red.

## 2024-10-08 MED ORDER — TRAMADOL HCL ER 200 MG PO TB24: 200.0000 mg | ORAL_TABLET | Freq: Every day | ORAL | 5 refills | Status: AC

## 2024-10-08 NOTE — Telephone Encounter (Signed)
 Nurtec was denied, Pt agreed to try Ajovy

## 2024-10-15 ENCOUNTER — Encounter: Payer: Self-pay | Admitting: Neurology

## 2024-10-26 ENCOUNTER — Encounter: Admitting: Physical Medicine & Rehabilitation

## 2024-12-07 ENCOUNTER — Ambulatory Visit: Admitting: Rheumatology

## 2024-12-27 ENCOUNTER — Ambulatory Visit: Admitting: Rheumatology

## 2025-04-24 ENCOUNTER — Ambulatory Visit: Admitting: Adult Health
